# Patient Record
Sex: Female | Born: 1965
Health system: Southern US, Community
[De-identification: ages and names within clinical notes are randomized; demographics above are authoritative.]

## PROBLEM LIST (undated history)

## (undated) DIAGNOSIS — I10 Essential (primary) hypertension: Secondary | ICD-10-CM

## (undated) DIAGNOSIS — K5900 Constipation, unspecified: Secondary | ICD-10-CM

## (undated) DIAGNOSIS — E079 Disorder of thyroid, unspecified: Secondary | ICD-10-CM

## (undated) DIAGNOSIS — E785 Hyperlipidemia, unspecified: Secondary | ICD-10-CM

## (undated) HISTORY — DX: Constipation, unspecified: K59.00

## (undated) HISTORY — DX: Hyperlipidemia, unspecified: E78.5

## (undated) HISTORY — DX: Essential (primary) hypertension: I10

## (undated) HISTORY — PX: DILATION AND CURETTAGE OF UTERUS: SHX78

---

## 1998-08-29 ENCOUNTER — Inpatient Hospital Stay (HOSPITAL_COMMUNITY): Admission: AD | Admit: 1998-08-29 | Discharge: 1998-09-01 | Payer: Self-pay | Admitting: Obstetrics and Gynecology

## 1998-09-02 ENCOUNTER — Encounter (HOSPITAL_COMMUNITY): Admission: RE | Admit: 1998-09-02 | Discharge: 1998-12-01 | Payer: Self-pay | Admitting: Obstetrics and Gynecology

## 2000-01-09 ENCOUNTER — Other Ambulatory Visit: Admission: RE | Admit: 2000-01-09 | Discharge: 2000-01-09 | Payer: Self-pay | Admitting: Obstetrics and Gynecology

## 2000-10-01 ENCOUNTER — Inpatient Hospital Stay (HOSPITAL_COMMUNITY): Admission: AD | Admit: 2000-10-01 | Discharge: 2000-10-03 | Payer: Self-pay | Admitting: *Deleted

## 2000-11-13 ENCOUNTER — Other Ambulatory Visit: Admission: RE | Admit: 2000-11-13 | Discharge: 2000-11-13 | Payer: Self-pay | Admitting: *Deleted

## 2001-11-18 ENCOUNTER — Other Ambulatory Visit: Admission: RE | Admit: 2001-11-18 | Discharge: 2001-11-18 | Payer: Self-pay | Admitting: *Deleted

## 2003-04-30 ENCOUNTER — Other Ambulatory Visit: Admission: RE | Admit: 2003-04-30 | Discharge: 2003-04-30 | Payer: Self-pay | Admitting: *Deleted

## 2004-06-06 ENCOUNTER — Ambulatory Visit (HOSPITAL_COMMUNITY): Admission: RE | Admit: 2004-06-06 | Discharge: 2004-06-06 | Payer: Self-pay | Admitting: Obstetrics

## 2006-05-16 ENCOUNTER — Encounter: Admission: RE | Admit: 2006-05-16 | Discharge: 2006-05-16 | Payer: Self-pay | Admitting: Obstetrics

## 2007-05-15 ENCOUNTER — Other Ambulatory Visit: Admission: RE | Admit: 2007-05-15 | Discharge: 2007-05-15 | Payer: Self-pay | Admitting: Family Medicine

## 2007-05-15 ENCOUNTER — Ambulatory Visit: Payer: Self-pay | Admitting: Family Medicine

## 2007-05-15 ENCOUNTER — Encounter: Payer: Self-pay | Admitting: Family Medicine

## 2007-05-15 DIAGNOSIS — R635 Abnormal weight gain: Secondary | ICD-10-CM | POA: Insufficient documentation

## 2007-05-15 DIAGNOSIS — K589 Irritable bowel syndrome without diarrhea: Secondary | ICD-10-CM

## 2007-05-20 ENCOUNTER — Telehealth (INDEPENDENT_AMBULATORY_CARE_PROVIDER_SITE_OTHER): Payer: Self-pay | Admitting: *Deleted

## 2008-07-13 ENCOUNTER — Ambulatory Visit: Payer: Self-pay | Admitting: Family Medicine

## 2008-07-13 ENCOUNTER — Other Ambulatory Visit: Admission: RE | Admit: 2008-07-13 | Discharge: 2008-07-13 | Payer: Self-pay | Admitting: Family Medicine

## 2008-07-13 ENCOUNTER — Encounter: Payer: Self-pay | Admitting: Family Medicine

## 2008-07-13 DIAGNOSIS — I10 Essential (primary) hypertension: Secondary | ICD-10-CM

## 2008-07-13 DIAGNOSIS — E785 Hyperlipidemia, unspecified: Secondary | ICD-10-CM

## 2008-07-13 LAB — CONVERTED CEMR LAB
ALT: 15 units/L (ref 0–35)
CO2: 19 meq/L (ref 19–32)
Calcium: 9.4 mg/dL (ref 8.4–10.5)
Chloride: 104 meq/L (ref 96–112)
Cholesterol: 211 mg/dL — ABNORMAL HIGH (ref 0–200)
Creatinine, Ser: 0.79 mg/dL (ref 0.40–1.20)
Glucose, Bld: 89 mg/dL (ref 70–99)
Sed Rate: 10 mm/hr (ref 0–22)
Total CHOL/HDL Ratio: 5.6
Total Protein: 7.3 g/dL (ref 6.0–8.3)

## 2008-07-14 ENCOUNTER — Encounter: Payer: Self-pay | Admitting: Family Medicine

## 2008-07-15 ENCOUNTER — Encounter: Admission: RE | Admit: 2008-07-15 | Discharge: 2008-07-15 | Payer: Self-pay | Admitting: Family Medicine

## 2008-07-15 ENCOUNTER — Telehealth: Payer: Self-pay | Admitting: Family Medicine

## 2009-07-21 ENCOUNTER — Encounter: Payer: Self-pay | Admitting: Family Medicine

## 2009-07-21 ENCOUNTER — Ambulatory Visit: Payer: Self-pay | Admitting: Family Medicine

## 2009-07-21 ENCOUNTER — Other Ambulatory Visit: Admission: RE | Admit: 2009-07-21 | Discharge: 2009-07-21 | Payer: Self-pay | Admitting: Family Medicine

## 2009-07-22 ENCOUNTER — Encounter: Payer: Self-pay | Admitting: Family Medicine

## 2009-07-23 LAB — CONVERTED CEMR LAB
Albumin: 4.6 g/dL (ref 3.5–5.2)
CO2: 22 meq/L (ref 19–32)
Cholesterol: 255 mg/dL — ABNORMAL HIGH (ref 0–200)
Glucose, Bld: 92 mg/dL (ref 70–99)
Potassium: 4.4 meq/L (ref 3.5–5.3)
Sodium: 140 meq/L (ref 135–145)
Total Protein: 7 g/dL (ref 6.0–8.3)
Triglycerides: 105 mg/dL (ref <150)

## 2009-07-30 ENCOUNTER — Encounter: Admission: RE | Admit: 2009-07-30 | Discharge: 2009-07-30 | Payer: Self-pay | Admitting: Family Medicine

## 2010-01-31 ENCOUNTER — Ambulatory Visit: Payer: Self-pay | Admitting: Family Medicine

## 2010-01-31 DIAGNOSIS — E663 Overweight: Secondary | ICD-10-CM | POA: Insufficient documentation

## 2010-02-01 ENCOUNTER — Encounter: Payer: Self-pay | Admitting: Family Medicine

## 2010-02-01 LAB — CONVERTED CEMR LAB
Free T4: 0.9 ng/dL (ref 0.80–1.80)
T3, Free: 2.4 pg/mL (ref 2.3–4.2)
TSH: 3.369 microintl units/mL (ref 0.350–4.500)

## 2010-08-05 ENCOUNTER — Encounter: Admission: RE | Admit: 2010-08-05 | Discharge: 2010-08-05 | Payer: Self-pay | Admitting: Family Medicine

## 2010-10-27 NOTE — Assessment & Plan Note (Signed)
Summary: f/u BP/ weight/ chol   Vital Signs:  Patient profile:   45 year old female Height:      64.5 inches Weight:      167 pounds BMI:     28.32 O2 Sat:      100 % on Room air Pulse rate:   87 / minute BP sitting:   135 / 87  (left arm) Cuff size:   large  Vitals Entered By: Payton Spark CMA (Jan 31, 2010 4:05 PM)  O2 Flow:  Room air CC: F/U HTN    Primary Care Provider:  Seymour Bars DO  CC:  F/U HTN .  History of Present Illness: 45 yo WF presents for f/u HTN and high cholesterol.  She is upset that she has not lost any weight despite eating a healthy diet that is portion - controlled.  She is walking but has not changed up her routine.  She is doing well on Norvasc 5 mg/ day.  Denies CP, DOE or leg swelling.  Denies heart palpitations.  She had a very high cholesterol in Oct, started on Crestor, doing well.  She is due to have LFts and LDL checked today.    Current Medications (verified): 1)  Womens One Daily  Tabs (Multiple Vitamins-Minerals) .... Take 1 Tab By Mouth Once Daily 2)  Fish Oil .... Take 2 Tabs By Mouth Once Daily 3)  Fiber 4)  Amlodipine Besylate 5 Mg Tabs (Amlodipine Besylate) .Marland Kitchen.. 1 Tab By Mouth Daily 5)  Crestor 10 Mg Tabs (Rosuvastatin Calcium) .Marland Kitchen.. 1 Tab By Mouth Qhs  Allergies (verified): 1)  ! Asa 2)  ! Tylenol  Past History:  Past Medical History: G7P3 HTN high cholesterol  Past Surgical History: Reviewed history from 05/15/2007 and no changes required. D&C 1998  Family History: Reviewed history from 05/15/2007 and no changes required. Mother and Father with HTN and hyperlipidemia brother and sister healthy  Social History: Reviewed history from 05/15/2007 and no changes required. Speech Therapist, self-employed. Married to [place] and has 3 kids. Never smoked. 1-2 coctails/ day Works out 3 days/wk --getting back into; wants to lose wt.  Review of Systems      See HPI  Physical Exam  General:  alert, well-developed,  well-nourished, and well-hydrated.   Head:  normocephalic and atraumatic.   Mouth:  pharynx pink and moist.   Neck:  no masses.   Lungs:  Normal respiratory effort, chest expands symmetrically. Lungs are clear to auscultation, no crackles or wheezes. Heart:  Normal rate and regular rhythm. S1 and S2 normal without gallop, murmur, click, rub or other extra sounds. Extremities:  no LE edema Skin:  color normal.   Psych:  good eye contact, not anxious appearing, and not depressed appearing.     Impression & Recommendations:  Problem # 1:  ESSENTIAL HYPERTENSION, BENIGN (ICD-401.1) Assessment Improved At goal of <140/90.  Continue.   Her updated medication list for this problem includes:    Amlodipine Besylate 5 Mg Tabs (Amlodipine besylate) .Marland Kitchen... 1 tab by mouth daily  BP today: 135/87 Prior BP: 161/100 (07/21/2009)  Labs Reviewed: K+: 4.4 (07/22/2009) Creat: : 0.79 (07/22/2009)   Chol: 255 (07/22/2009)   HDL: 42 (07/22/2009)   LDL: 192 (07/22/2009)   TG: 105 (07/22/2009)  Problem # 2:  HYPERLIPIDEMIA (ICD-272.4) Due for LDL and LFTs today. Her updated medication list for this problem includes:    Crestor 10 Mg Tabs (Rosuvastatin calcium) .Marland Kitchen... 1 tab by mouth qhs  Orders: T-LDL  Direct [phone]) T-AST/SGOT [phone]) T-ALT/SGPT [phone])  Labs Reviewed: SGOT: 10 (07/22/2009)   SGPT: 10 (07/22/2009)   HDL:42 (07/22/2009), 38 (07/13/2008)  LDL:192 (07/22/2009), 152 (07/13/2008)  Chol:255 (07/22/2009), 211 (07/13/2008)  Trig:105 (07/22/2009), 107 (07/13/2008)  Problem # 3:  OVERWEIGHT (ICD-278.02) BMI 28.  24 hr food recall done.  Eating healthy diet  ~1500 kcal/ day.  Her exercise is only enough to maintain her current wt.  Advised stepping up her exercise routine and doing some cross training.  If failing to lose at her 2 mos f/u; consider adding medicaitons.  Complete Medication List: 1)  Womens One Daily Tabs (Multiple vitamins-minerals) .... Take 1 tab by mouth  once daily 2)  Fish Oil  .... Take 2 tabs by mouth once daily 3)  Fiber  4)  Amlodipine Besylate 5 Mg Tabs (Amlodipine besylate) .Marland Kitchen.. 1 tab by mouth daily 5)  Crestor 10 Mg Tabs (Rosuvastatin calcium) .Marland Kitchen.. 1 tab by mouth qhs  Other Orders: T-T3, Free [phone]) T-T4, Free [phone]) T-TSH [phone])  Patient Instructions: 1)  Lab today. 2)  Will call you w/ results tomorrow. 3)  BP at goal. 4)  Work on increasing exercise to 1 hr (cross training) 5 days/ wk. 5)  Stay on healthy diet.  Try to to get protein at each meals. 6)  Return for weight recheck in 2 mos. Prescriptions: CRESTOR 10 MG TABS (ROSUVASTATIN CALCIUM) 1 tab by mouth qhs  #90 x 1   Entered and Authorized by:   Seymour Bars DO   Signed by:   Seymour Bars DO on 01/31/2010   Method used:   Electronically to        [facility]* (mail-order)             ,          Ph: [phone]       Fax: [phone]   RxID:   [id number] AMLODIPINE BESYLATE 5 MG TABS (AMLODIPINE BESYLATE) 1 tab by mouth daily  #90 x 1   Entered and Authorized by:   Seymour Bars DO   Signed by:   Seymour Bars DO on 01/31/2010   Method used:   Electronically to        [facility]* (mail-order)             ,          Ph: [phone]       Fax: [phone]   RxID:   [id number]

## 2011-04-18 ENCOUNTER — Other Ambulatory Visit: Payer: Self-pay | Admitting: Family Medicine

## 2011-04-23 ENCOUNTER — Encounter: Payer: Self-pay | Admitting: Family Medicine

## 2011-04-24 ENCOUNTER — Encounter: Payer: Self-pay | Admitting: Family Medicine

## 2011-04-24 ENCOUNTER — Other Ambulatory Visit (HOSPITAL_COMMUNITY)
Admission: RE | Admit: 2011-04-24 | Discharge: 2011-04-24 | Disposition: A | Discharge status: Home / Self Care | Payer: BC Managed Care – PPO | Source: Ambulatory Visit | Attending: Family Medicine | Admitting: Family Medicine

## 2011-04-24 ENCOUNTER — Ambulatory Visit (INDEPENDENT_AMBULATORY_CARE_PROVIDER_SITE_OTHER): Payer: BC Managed Care – PPO | Admitting: Family Medicine

## 2011-04-24 DIAGNOSIS — I10 Essential (primary) hypertension: Secondary | ICD-10-CM

## 2011-04-24 DIAGNOSIS — E785 Hyperlipidemia, unspecified: Secondary | ICD-10-CM

## 2011-04-24 DIAGNOSIS — E039 Hypothyroidism, unspecified: Secondary | ICD-10-CM

## 2011-04-24 DIAGNOSIS — Z01419 Encounter for gynecological examination (general) (routine) without abnormal findings: Secondary | ICD-10-CM | POA: Insufficient documentation

## 2011-04-24 MED ORDER — ROSUVASTATIN CALCIUM 10 MG PO TABS: 10.0000 mg | ORAL_TABLET | Freq: Every day | ORAL | Status: DC

## 2011-04-24 MED ORDER — AMLODIPINE BESYLATE 10 MG PO TABS: 10.0000 mg | ORAL_TABLET | Freq: Every day | ORAL | Status: DC

## 2011-04-24 NOTE — Patient Instructions (Signed)
Will call you with pap smear results in the next week.  Update fasting labs. Will call you with results.  Stay on current meds.  Return for f/u BP in 6 mos.

## 2011-04-24 NOTE — Progress Notes (Signed)
  Subjective:    Patient ID: Christina Grimes, female    DOB: 1966/03/03, 45 y.o.   MRN: [id number]  HPI 45 yo WF presents for CPE with pap smear.   She is doing well on her meds, due for labs.  Tdap done 08.   Periods are regular.  Husband had a vasectomy.   No fam hx of premature heart dz, breast or colon cancer.  Her mammogram was done in Sept 2011..  She eats healthy and exercises on a regular basis. She is working on [employer] and regular exercise.  She never did start thyroid medication.  BP 144/89  Pulse 89  Ht 5\' 5"  (1.651 m)  Wt 179 lb (81.194 kg)  BMI 29.79 kg/m2  LMP 04/10/2011     Review of Systems Gen: no fevers, chills, hot flashes, night sweats, change in weight GI: no N/V/C/D GU: no dysuria, incontinence or sexual dysfunction CV: no chest pain, DOE, palpitations s or edema Pulm:  Denies CP, SOB or chronic cough     Objective:   Physical Exam  Pulmonary/Chest: Right breast exhibits no mass, no nipple discharge, no skin change and no tenderness. Left breast exhibits no mass, no nipple discharge, no skin change and no tenderness.  Genitourinary: Vagina normal and uterus normal. No vaginal discharge found.   Gen: alert, well groomed in NAD Neck: no thyromegaly or cervical lymphadenopathy CV: RRR w/o murmur, no audible carotid bruits or abdominal aortic bruits Ext: no edema, clubbing or cyanosis Lungs: CTA bilat w/o W/R/R; nonlabored HEENT:  [place]/AT; PERRLA; oropharynx pink and moist with good dentition Abd: soft, NT, ND, NABS, No HSM, no audible AA bruits Skin: warm and dry; no rash, pallor or jaundice Psych: does not appear anxious or depressed; answers questions appropriately       Assessment & Plan:  Assesment:  1. CPE- Keeping healthy checklist for women reviewed today.  BP a little high, even on repeat.  Will change her Norvasc from 5 to 10 mg/ day.  This has really helped her Raynaud's symptoms.    BMI 29  in the overwt  range.     Labs  ordered Colonoscopy due at 52 Contraception- vasectomy Last pap/- updated today. Mammogram due in Sept. Encouraged healthy diet, regular exercise, MVI daily. Return for next physical in 1 yr.

## 2011-04-26 ENCOUNTER — Telehealth: Payer: Self-pay | Admitting: Family Medicine

## 2011-04-26 LAB — CBC WITH DIFFERENTIAL/PLATELET
Eosinophils Absolute: 0.1 10*3/uL (ref 0.0–0.7)
Eosinophils Relative: 2 % (ref 0–5)
Lymphs Abs: 2.4 10*3/uL (ref 0.7–4.0)
MCH: 30 pg (ref 26.0–34.0)
MCV: 95 fL (ref 78.0–100.0)
Monocytes Absolute: 0.4 10*3/uL (ref 0.1–1.0)
Monocytes Relative: 5 % (ref 3–12)
Platelets: 259 10*3/uL (ref 150–400)
RBC: 4.36 MIL/uL (ref 3.87–5.11)

## 2011-04-26 LAB — COMPLETE METABOLIC PANEL WITH GFR
Albumin: 4.2 g/dL (ref 3.5–5.2)
Alkaline Phosphatase: 61 U/L (ref 39–117)
BUN: 7 mg/dL (ref 6–23)
GFR, Est Non African American: >60 mL/min (ref >60)
Glucose, Bld: 99 mg/dL (ref 70–99)
Total Bilirubin: 0.7 mg/dL (ref 0.3–1.2)

## 2011-04-26 LAB — LIPID PANEL
Cholesterol: 130 mg/dL (ref 0–200)
Total CHOL/HDL Ratio: 3.3 Ratio

## 2011-04-26 NOTE — Telephone Encounter (Signed)
LMOM advising pt of results and rec. 

## 2011-04-26 NOTE — Telephone Encounter (Signed)
Pls let pt know that her blood counts, thyroid function,fasting sugar, liver and kidney function came back normal.  Her cholesterol is at goal other than a low HDL good chol of 39, should be >49.  Add Omega 3 Fish Oil 2 grams day to current meds.  Repeat in 1 yr.

## 2011-04-29 ENCOUNTER — Telehealth: Payer: Self-pay | Admitting: Family Medicine

## 2011-04-29 NOTE — Telephone Encounter (Signed)
Pls let pt know that her pap smear came back nromal.  Repeat in 2 yrs.

## 2011-05-04 NOTE — Telephone Encounter (Signed)
Pt notified of results via VM. KJ LPN 

## 2011-07-25 ENCOUNTER — Other Ambulatory Visit: Payer: Self-pay | Admitting: Family Medicine

## 2011-07-25 DIAGNOSIS — Z1231 Encounter for screening mammogram for malignant neoplasm of breast: Secondary | ICD-10-CM

## 2011-08-10 ENCOUNTER — Ambulatory Visit
Admission: RE | Admit: 2011-08-10 | Discharge: 2011-08-10 | Disposition: A | Discharge status: Home / Self Care | Payer: BC Managed Care – PPO | Source: Ambulatory Visit | Attending: Family Medicine | Admitting: Family Medicine

## 2011-08-10 DIAGNOSIS — Z1231 Encounter for screening mammogram for malignant neoplasm of breast: Secondary | ICD-10-CM

## 2012-03-29 ENCOUNTER — Encounter: Payer: Self-pay | Admitting: Physician Assistant

## 2012-03-29 ENCOUNTER — Ambulatory Visit (INDEPENDENT_AMBULATORY_CARE_PROVIDER_SITE_OTHER): Payer: BC Managed Care – PPO | Admitting: Physician Assistant

## 2012-03-29 VITALS — BP 147/96 | HR 96 | Temp 98.2°F | Ht 65.0 in | Wt 176.0 lb

## 2012-03-29 DIAGNOSIS — R1032 Left lower quadrant pain: Secondary | ICD-10-CM

## 2012-03-29 DIAGNOSIS — N390 Urinary tract infection, site not specified: Secondary | ICD-10-CM

## 2012-03-29 DIAGNOSIS — R109 Unspecified abdominal pain: Secondary | ICD-10-CM

## 2012-03-29 LAB — POCT URINALYSIS DIPSTICK
Bilirubin, UA: NEGATIVE
Ketones, UA: NEGATIVE
Spec Grav, UA: 1.025
pH, UA: 6.5

## 2012-03-29 MED ORDER — CIPROFLOXACIN HCL 500 MG PO TABS: 500.0000 mg | ORAL_TABLET | Freq: Two times a day (BID) | ORAL | Status: AC

## 2012-03-29 NOTE — Patient Instructions (Addendum)
Give [place] a call if you don't here with an appt by Tuesday of next week.  Start Linzess daily for constipation. Call if working and can give prescription.  STart cipro for 3 days for UTI.

## 2012-03-29 NOTE — Progress Notes (Signed)
  Subjective:    Patient ID: Christina Grimes, female    DOB: 1965-11-10, 46 y.o.   MRN: [id number]  HPI Patient presents to clinic with 4-6 weeks of left lower quadrant pain. Denies fever, chills, myalgias. Rate pain 6/10 when active but pain is not constant. Has a hx of chronic constipation but doesn't feel like this is constipation it "feels" different. Denies any blood in stool. She does take a daily probiotic for constipation. Tried miralax and other OTC and have not helped. She has only had one partner and denies any unfaithfulness. Denies any dysuria or vaginal discharge. She has not hx of ovarian problems. Has a monthly period. Her periods have been more heavy recently. Has been taking Advil and has helped some.     Review of Systems     Objective:   Physical Exam  Constitutional: She is oriented to person, place, and time. She appears well-developed and well-nourished.  HENT:  Head: Normocephalic and atraumatic.  Cardiovascular: Normal rate, regular rhythm and normal heart sounds.   Pulmonary/Chest: Effort normal and breath sounds normal. She has no wheezes.       No CVA tenderness.  Abdominal: Soft. Bowel sounds are normal. She exhibits distension. She exhibits no mass. There is tenderness. There is no rebound and no guarding.       Tenderness to palpation over left lower quadrant. Slight distension over abdomen.   Genitourinary: Vagina normal and uterus normal. No vaginal discharge found.       bi manuel exam: negative for tenderness/pain/masses  Neurological: She is alert and oriented to person, place, and time.  Skin: Skin is warm and dry.  Psychiatric: She has a normal mood and affect. Her behavior is normal.          Assessment & Plan:  UTI- UA- +for blood and leuks. Will treat with Cipro for 3 days. Call if not improving.   Lower left abdominal pain/chronic constipation- No masses felt with bi manuel exam. Will get pelvic U/S to evaluate ovaries. Pt has hx of chronic  constipation gave samples of linzess to see if this could help her have a bowel movement and maybe decrease some of her pain. Discussed Linzess and how this is a daily medication to help with constipation. Call office if worsening. Will call with U/S results.

## 2012-04-01 ENCOUNTER — Other Ambulatory Visit (HOSPITAL_BASED_OUTPATIENT_CLINIC_OR_DEPARTMENT_OTHER): Payer: Self-pay | Admitting: *Deleted

## 2012-04-01 ENCOUNTER — Telehealth: Payer: Self-pay | Admitting: *Deleted

## 2012-04-01 ENCOUNTER — Ambulatory Visit (INDEPENDENT_AMBULATORY_CARE_PROVIDER_SITE_OTHER): Payer: BC Managed Care – PPO

## 2012-04-01 ENCOUNTER — Other Ambulatory Visit: Payer: Self-pay | Admitting: Physician Assistant

## 2012-04-01 DIAGNOSIS — R1032 Left lower quadrant pain: Secondary | ICD-10-CM

## 2012-04-01 DIAGNOSIS — N83209 Unspecified ovarian cyst, unspecified side: Secondary | ICD-10-CM

## 2012-04-01 MED ORDER — LINACLOTIDE 290 MCG PO CAPS: 1.0000 | ORAL_CAPSULE | Freq: Every morning | ORAL | Status: DC

## 2012-04-01 NOTE — Telephone Encounter (Signed)
Will send to [facility]. Let patient know that we have cards to give a discounted price even with insurance. She does need to pick up car and will need to call pharmacy with activation numbers. This should significantly make coughs cheaper. I will send over Rx today for a six-month supply but she needs to pick up card as soon as possible.

## 2012-04-01 NOTE — Telephone Encounter (Signed)
Pt calls and states that the Linzess samples that you gave her are working and she would like a prescription for it sent to [facility].

## 2012-06-03 ENCOUNTER — Other Ambulatory Visit: Payer: Self-pay | Admitting: *Deleted

## 2012-06-03 MED ORDER — AMLODIPINE BESYLATE 10 MG PO TABS: 10.0000 mg | ORAL_TABLET | Freq: Every day | ORAL | Status: DC

## 2012-06-12 ENCOUNTER — Encounter: Payer: Self-pay | Admitting: Physician Assistant

## 2012-06-12 ENCOUNTER — Other Ambulatory Visit (HOSPITAL_COMMUNITY)
Admission: RE | Admit: 2012-06-12 | Discharge: 2012-06-12 | Disposition: A | Discharge status: Home / Self Care | Payer: BC Managed Care – PPO | Source: Ambulatory Visit | Attending: Family Medicine | Admitting: Family Medicine

## 2012-06-12 ENCOUNTER — Ambulatory Visit (INDEPENDENT_AMBULATORY_CARE_PROVIDER_SITE_OTHER): Payer: BC Managed Care – PPO | Admitting: Physician Assistant

## 2012-06-12 ENCOUNTER — Encounter: Payer: BC Managed Care – PPO | Admitting: Physician Assistant

## 2012-06-12 VITALS — BP 139/89 | HR 92 | Temp 98.1°F | Ht 65.0 in | Wt 178.0 lb

## 2012-06-12 DIAGNOSIS — E785 Hyperlipidemia, unspecified: Secondary | ICD-10-CM

## 2012-06-12 DIAGNOSIS — I10 Essential (primary) hypertension: Secondary | ICD-10-CM

## 2012-06-12 DIAGNOSIS — K589 Irritable bowel syndrome without diarrhea: Secondary | ICD-10-CM

## 2012-06-12 DIAGNOSIS — Z1239 Encounter for other screening for malignant neoplasm of breast: Secondary | ICD-10-CM

## 2012-06-12 DIAGNOSIS — J029 Acute pharyngitis, unspecified: Secondary | ICD-10-CM

## 2012-06-12 DIAGNOSIS — Z01419 Encounter for gynecological examination (general) (routine) without abnormal findings: Secondary | ICD-10-CM

## 2012-06-12 DIAGNOSIS — Z131 Encounter for screening for diabetes mellitus: Secondary | ICD-10-CM

## 2012-06-12 DIAGNOSIS — Z0000 Encounter for general adult medical examination without abnormal findings: Secondary | ICD-10-CM

## 2012-06-12 MED ORDER — AMLODIPINE BESYLATE 10 MG PO TABS: 10.0000 mg | ORAL_TABLET | Freq: Every day | ORAL | Status: DC

## 2012-06-12 NOTE — Patient Instructions (Addendum)
Mucinex D twice a day for next couple of days. Drink lots of water. Load up on Vitamin C and Zinc. Will call with lab results.  Keep exercising regularly and calcium 4 servings of 500mg  twice a day.

## 2012-06-13 LAB — LIPID PANEL
HDL: 45 mg/dL (ref >39)
LDL Cholesterol: 159 mg/dL — ABNORMAL HIGH (ref 0–99)
Total CHOL/HDL Ratio: 5.2 Ratio
VLDL: 32 mg/dL (ref 0–40)

## 2012-06-13 LAB — COMPREHENSIVE METABOLIC PANEL
ALT: 13 U/L (ref 0–35)
Alkaline Phosphatase: 60 U/L (ref 39–117)
Creat: 0.78 mg/dL (ref 0.50–1.10)
Sodium: 138 mEq/L (ref 135–145)
Total Bilirubin: 1.2 mg/dL (ref 0.3–1.2)
Total Protein: 7.2 g/dL (ref 6.0–8.3)

## 2012-06-13 NOTE — Progress Notes (Signed)
Subjective:     Christina Grimes is a 46 y.o. female and is here for a comprehensive physical exam. The patient reports problems - she has a sore throat that started this morning. she denies any fever, chills, headaches, ear pain. She has not tried anything. Her daughter did have strep throat. Denies any sinus pressure.   Hypertension ongoing uncontrolled. Pt reports always in 120's over 70's at the [facility] when she works out. Denies CP, palpitations, SoB, Headaches, vision changes.  Hyperlipidemia ongoing and not bee controlled. She has not taken Crestor in past 2 weeks because she was out. She tries to maintain a good diet but still eats a lot of fried foods.   Her husband has HPV throat cancer. We will do pap even since last pap was normal. She will need a mammogram in November.   IBS constipation is well controlled with linzess. She is very happy but states it is very experience.   History   Social History  . Marital Status: Married    Spouse Name: N/A    Number of Children: N/A  . Years of Education: N/A   Occupational History  . Not on file.   Social History Main Topics  . Smoking status: Never Smoker   . Smokeless tobacco: Not on file  . Alcohol Use:   . Drug Use:   . Sexually Active:    Other Topics Concern  . Not on file   Social History Narrative  . No narrative on file   Health Maintenance  Topic Date Due  . Mammogram  08/09/2012  . Influenza Vaccine  11/27/2012  . Pap Smear  06/13/2015  . Tetanus/tdap  05/14/2017    The following portions of the patient's history were reviewed and updated as appropriate: allergies, current medications, past family history, past medical history, past social history, past surgical history and problem list.  Review of Systems A comprehensive review of systems was negative.   Objective:    BP 139/89  Pulse 92  Temp 98.1 F (36.7 C) (Oral)  Ht 5\' 5"  (1.651 m)  Wt 178 lb (80.74 kg)  BMI 29.62 kg/m2  SpO2 100%  LMP  05/15/2012 General appearance: alert, cooperative and appears stated age Head: Normocephalic, without obvious abnormality, atraumatic Eyes: conjunctivae/corneas clear. PERRL, EOM's intact. Fundi benign. Ears: normal TM's and external ear canals both ears Nose: Nares normal. Septum midline. Mucosa normal. No drainage or sinus tenderness. Throat: lips, mucosa, and tongue normal; teeth and gums normal Neck: no adenopathy, no carotid bruit, no JVD, supple, symmetrical, trachea midline and thyroid not enlarged, symmetric, no tenderness/mass/nodules Back: symmetric, no curvature. ROM normal. No CVA tenderness. Lungs: clear to auscultation bilaterally Breasts: normal appearance, no masses or tenderness Heart: regular rate and rhythm, S1, S2 normal, no murmur, click, rub or gallop Abdomen: soft, non-tender; bowel sounds normal; no masses,  no organomegaly Pelvic: cervix normal in appearance, external genitalia normal, no adnexal masses or tenderness, no cervical motion tenderness, uterus normal size, shape, and consistency and white thick discharege noted on exam Extremities: extremities normal, atraumatic, no cyanosis or edema Pulses: 2+ and symmetric Skin: Skin color, texture, turgor normal. No rashes or lesions Lymph nodes: Cervical, supraclavicular, and axillary nodes normal. Neurologic: Grossly normal    Assessment:    Healthy female exam.      Plan:    CPE/IBS/Sore throat/HTN/Hyperlipidemia-Gave patient some symptomatic ways to treat sore throat and drainage. It is too soon to treat for infectious causes. If pain worsens then  call office and will treat since you do have a contact for strep. Gave pt samples of Linzess and paperwork to get Linzess at a cheaper price. If not I suggest taking every other day and then we can help with samples. Labs are to be drawn. Encouraged patient to have regular exercise and have healthy diet. Already has Flu shot. Will refer for mammogram. Refilled  mediations. Will refill cholesterol medication after results. Continue to monitor BP if running high 130's/high 80's then call office will consider making some med adjustments. F/U in 3 months to recheck BP. See After Visit Summary for Counseling Recommendations

## 2012-06-14 ENCOUNTER — Other Ambulatory Visit: Payer: Self-pay | Admitting: *Deleted

## 2012-06-14 ENCOUNTER — Other Ambulatory Visit: Payer: Self-pay | Admitting: Physician Assistant

## 2012-06-14 MED ORDER — ROSUVASTATIN CALCIUM 20 MG PO TABS: 20.0000 mg | ORAL_TABLET | Freq: Every day | ORAL | Status: DC

## 2012-06-26 ENCOUNTER — Other Ambulatory Visit: Payer: Self-pay | Admitting: *Deleted

## 2012-06-26 MED ORDER — ROSUVASTATIN CALCIUM 20 MG PO TABS: 20.0000 mg | ORAL_TABLET | Freq: Every day | ORAL | Status: DC

## 2012-06-27 ENCOUNTER — Telehealth: Payer: Self-pay | Admitting: *Deleted

## 2012-06-27 NOTE — Telephone Encounter (Signed)
Pt is asking if you can send a diiferent med other than Crestor. States they are charging her $480 for 3 months.

## 2012-06-28 MED ORDER — ATORVASTATIN CALCIUM 40 MG PO TABS: 40.0000 mg | ORAL_TABLET | Freq: Every day | ORAL | Status: DC

## 2012-06-28 NOTE — Telephone Encounter (Signed)
Crestor was what you were on originally I just increased it. We can try lipitor 40mg  daily.

## 2012-06-28 NOTE — Telephone Encounter (Signed)
Pt.notified

## 2012-08-15 ENCOUNTER — Other Ambulatory Visit: Payer: Self-pay | Admitting: Physician Assistant

## 2012-08-15 ENCOUNTER — Ambulatory Visit
Admission: RE | Admit: 2012-08-15 | Discharge: 2012-08-15 | Disposition: A | Discharge status: Home / Self Care | Payer: BC Managed Care – PPO | Source: Ambulatory Visit | Attending: Physician Assistant | Admitting: Physician Assistant

## 2012-08-15 DIAGNOSIS — R928 Other abnormal and inconclusive findings on diagnostic imaging of breast: Secondary | ICD-10-CM

## 2012-08-15 DIAGNOSIS — Z1239 Encounter for other screening for malignant neoplasm of breast: Secondary | ICD-10-CM

## 2012-08-16 ENCOUNTER — Ambulatory Visit
Admission: RE | Admit: 2012-08-16 | Discharge: 2012-08-16 | Disposition: A | Discharge status: Home / Self Care | Payer: BC Managed Care – PPO | Source: Ambulatory Visit | Attending: Physician Assistant | Admitting: Physician Assistant

## 2012-08-16 DIAGNOSIS — R928 Other abnormal and inconclusive findings on diagnostic imaging of breast: Secondary | ICD-10-CM

## 2013-01-20 ENCOUNTER — Other Ambulatory Visit: Payer: Self-pay | Admitting: *Deleted

## 2013-01-20 MED ORDER — LINACLOTIDE 290 MCG PO CAPS: 1.0000 | ORAL_CAPSULE | Freq: Every morning | ORAL | Status: DC

## 2013-01-20 MED ORDER — ATORVASTATIN CALCIUM 40 MG PO TABS: 40.0000 mg | ORAL_TABLET | Freq: Every day | ORAL | Status: DC

## 2013-05-28 ENCOUNTER — Other Ambulatory Visit: Payer: Self-pay | Admitting: Physician Assistant

## 2013-07-30 ENCOUNTER — Encounter: Payer: Self-pay | Admitting: Physician Assistant

## 2013-07-30 ENCOUNTER — Ambulatory Visit (INDEPENDENT_AMBULATORY_CARE_PROVIDER_SITE_OTHER): Payer: BC Managed Care – PPO | Admitting: Physician Assistant

## 2013-07-30 ENCOUNTER — Other Ambulatory Visit (HOSPITAL_COMMUNITY)
Admission: RE | Admit: 2013-07-30 | Discharge: 2013-07-30 | Disposition: A | Discharge status: Home / Self Care | Payer: BC Managed Care – PPO | Source: Ambulatory Visit | Attending: Family Medicine | Admitting: Family Medicine

## 2013-07-30 VITALS — BP 141/86 | HR 91 | Wt 191.0 lb

## 2013-07-30 DIAGNOSIS — Z01419 Encounter for gynecological examination (general) (routine) without abnormal findings: Secondary | ICD-10-CM | POA: Insufficient documentation

## 2013-07-30 DIAGNOSIS — Z131 Encounter for screening for diabetes mellitus: Secondary | ICD-10-CM

## 2013-07-30 DIAGNOSIS — I10 Essential (primary) hypertension: Secondary | ICD-10-CM

## 2013-07-30 DIAGNOSIS — Z0000 Encounter for general adult medical examination without abnormal findings: Secondary | ICD-10-CM

## 2013-07-30 DIAGNOSIS — Z23 Encounter for immunization: Secondary | ICD-10-CM

## 2013-07-30 DIAGNOSIS — Z1151 Encounter for screening for human papillomavirus (HPV): Secondary | ICD-10-CM | POA: Insufficient documentation

## 2013-07-30 DIAGNOSIS — Z13220 Encounter for screening for lipoid disorders: Secondary | ICD-10-CM

## 2013-07-30 DIAGNOSIS — E785 Hyperlipidemia, unspecified: Secondary | ICD-10-CM

## 2013-07-30 MED ORDER — LINACLOTIDE 290 MCG PO CAPS: 290.0000 ug | ORAL_CAPSULE | Freq: Every morning | ORAL | Status: DC

## 2013-07-30 MED ORDER — LOSARTAN POTASSIUM-HCTZ 50-12.5 MG PO TABS: 1.0000 | ORAL_TABLET | Freq: Every day | ORAL | Status: DC

## 2013-07-30 NOTE — Patient Instructions (Signed)

## 2013-07-30 NOTE — Progress Notes (Signed)
  Subjective:     Christina Grimes is a 47 y.o. female and is here for a comprehensive physical exam. The patient reports no problems.  History   Social History  . Marital Status: Married    Spouse Name: N/A    Number of Children: N/A  . Years of Education: N/A   Occupational History  . Not on file.   Social History Main Topics  . Smoking status: Never Smoker   . Smokeless tobacco: Not on file  . Alcohol Use:   . Drug Use:   . Sexual Activity:    Other Topics Concern  . Not on file   Social History Narrative  . No narrative on file   Health Maintenance  Topic Date Due  . Mammogram  08/16/2013  . Influenza Vaccine  04/25/2014  . Pap Smear  06/13/2015  . Tetanus/tdap  05/14/2017    The following portions of the patient's history were reviewed and updated as appropriate: allergies, current medications, past family history, past medical history, past social history, past surgical history and problem list.  Review of Systems A comprehensive review of systems was negative.   Objective:    BP 141/86  Pulse 91  Wt 191 lb (86.637 kg) General appearance: alert, cooperative, appears stated age and mildly obese Head: Normocephalic, without obvious abnormality, atraumatic Eyes: conjunctivae/corneas clear. PERRL, EOM's intact. Fundi benign. Ears: normal TM's and external ear canals both ears Nose: Nares normal. Septum midline. Mucosa normal. No drainage or sinus tenderness. Throat: lips, mucosa, and tongue normal; teeth and gums normal Neck: no adenopathy, no carotid bruit, no JVD, supple, symmetrical, trachea midline and thyroid not enlarged, symmetric, no tenderness/mass/nodules Back: symmetric, no curvature. ROM normal. No CVA tenderness. Lungs: clear to auscultation bilaterally Breasts: normal appearance, no masses or tenderness Heart: regular rate and rhythm, S1, S2 normal, no murmur, click, rub or gallop Abdomen: soft, non-tender; bowel sounds normal; no masses,  no  organomegaly Pelvic: cervix normal in appearance, external genitalia normal, no adnexal masses or tenderness, no cervical motion tenderness, uterus normal size, shape, and consistency and vagina normal without discharge Extremities: bilateral 1+ edema around ankles. Pulses: 2+ and symmetric Skin: Skin color, texture, turgor normal. No rashes or lesions Lymph nodes: Cervical, supraclavicular, and axillary nodes normal. Neurologic: Grossly normal    Assessment:    Healthy female exam.     Plan:     CPE- Patient's husband has a history of HPV therefore she opts to get Pap smear yearly. Pap done today and we'll call with results. Mammogram scheduled for December. Screening labs were given to patient to have done when fasting for at least 8 hours. Patient was encouraged to participate in regular exercise 3-5 times a week as well as low fat diet. Recommended calcium and vitamin D supplements 1200 mg of calcium and 800 mg of vitamin D. Flu shot was given without complications and the office today.   IBS-patient loves linzess and been paying full price 300 dollars a month. Gave savings car today as well as some samples. Patient was instructed not to pay full price and eyes to work on getting this cheaper.  Ankle swellingHTN -this has been present ever since increasing her dose of Norvasc. I suspect this is a side effect. Listed in the allergy profile. Patient instructed to stop. Patient will was sent Hyzaar to take daily. Blood pressure a little bit elevated today we'll recheck in one month.  See After Visit Summary for Counseling Recommendations

## 2013-08-11 LAB — COMPLETE METABOLIC PANEL WITHOUT GFR
ALT: 23 U/L (ref 0–35)
AST: 15 U/L (ref 0–37)
Albumin: 4.6 g/dL (ref 3.5–5.2)
Alkaline Phosphatase: 74 U/L (ref 39–117)
BUN: 10 mg/dL (ref 6–23)
CO2: 23 meq/L (ref 19–32)
Calcium: 9.6 mg/dL (ref 8.4–10.5)
Chloride: 103 meq/L (ref 96–112)
Creat: 0.79 mg/dL (ref 0.50–1.10)
GFR, Est African American: >89 mL/min
GFR, Est Non African American: >89 mL/min
Glucose, Bld: 96 mg/dL (ref 70–99)
Potassium: 4 meq/L (ref 3.5–5.3)
Sodium: 136 meq/L (ref 135–145)
Total Bilirubin: 1.2 mg/dL (ref 0.3–1.2)
Total Protein: 7.2 g/dL (ref 6.0–8.3)

## 2013-08-11 LAB — LIPID PANEL
LDL Cholesterol: 71 mg/dL (ref 0–99)
VLDL: 24 mg/dL (ref 0–40)

## 2013-08-30 ENCOUNTER — Other Ambulatory Visit: Payer: Self-pay | Admitting: Physician Assistant

## 2013-09-29 ENCOUNTER — Other Ambulatory Visit: Payer: Self-pay

## 2013-09-29 DIAGNOSIS — Z1231 Encounter for screening mammogram for malignant neoplasm of breast: Secondary | ICD-10-CM

## 2013-10-07 ENCOUNTER — Encounter: Payer: Self-pay | Admitting: Physician Assistant

## 2013-10-07 ENCOUNTER — Ambulatory Visit
Admission: RE | Admit: 2013-10-07 | Discharge: 2013-10-07 | Disposition: A | Discharge status: Home / Self Care | Payer: BC Managed Care – PPO | Source: Ambulatory Visit

## 2013-10-07 DIAGNOSIS — Z1231 Encounter for screening mammogram for malignant neoplasm of breast: Secondary | ICD-10-CM

## 2013-10-07 DIAGNOSIS — R928 Other abnormal and inconclusive findings on diagnostic imaging of breast: Secondary | ICD-10-CM | POA: Insufficient documentation

## 2013-10-08 ENCOUNTER — Other Ambulatory Visit: Payer: Self-pay | Admitting: Physician Assistant

## 2013-10-08 DIAGNOSIS — R928 Other abnormal and inconclusive findings on diagnostic imaging of breast: Secondary | ICD-10-CM

## 2013-10-20 ENCOUNTER — Encounter: Payer: Self-pay | Admitting: Physician Assistant

## 2013-10-20 ENCOUNTER — Ambulatory Visit
Admission: RE | Admit: 2013-10-20 | Discharge: 2013-10-20 | Disposition: A | Discharge status: Home / Self Care | Payer: BC Managed Care – PPO | Source: Ambulatory Visit | Attending: Physician Assistant | Admitting: Physician Assistant

## 2013-10-20 DIAGNOSIS — R928 Other abnormal and inconclusive findings on diagnostic imaging of breast: Secondary | ICD-10-CM

## 2013-10-20 DIAGNOSIS — N6320 Unspecified lump in the left breast, unspecified quadrant: Secondary | ICD-10-CM | POA: Insufficient documentation

## 2013-11-10 ENCOUNTER — Telehealth: Payer: Self-pay | Admitting: *Deleted

## 2013-11-10 NOTE — Telephone Encounter (Signed)
Pt left vm asking if you could send something dif or give her some suggestions on something other than the linzess.  She states that it's over $330 & even with the coupon card she's still responsible for around $275.

## 2013-11-10 NOTE — Telephone Encounter (Signed)
Left detailed message on vm.

## 2013-11-10 NOTE — Telephone Encounter (Signed)
The only way it should be that much is if you have a medication deductable to meet? Do you? All medication would apply to deductable.  We can try amitiza but it is a branded product as well. Miralax is the only cheaper option.

## 2013-11-30 ENCOUNTER — Other Ambulatory Visit: Payer: Self-pay | Admitting: Physician Assistant

## 2014-03-17 ENCOUNTER — Other Ambulatory Visit: Payer: Self-pay | Admitting: Physician Assistant

## 2014-03-18 ENCOUNTER — Ambulatory Visit (INDEPENDENT_AMBULATORY_CARE_PROVIDER_SITE_OTHER): Payer: BC Managed Care – PPO | Admitting: Physician Assistant

## 2014-03-18 ENCOUNTER — Encounter: Payer: Self-pay | Admitting: Physician Assistant

## 2014-03-18 ENCOUNTER — Other Ambulatory Visit: Payer: Self-pay | Admitting: Physician Assistant

## 2014-03-18 VITALS — BP 123/72 | HR 91 | Ht 65.0 in | Wt 174.0 lb

## 2014-03-18 DIAGNOSIS — N6002 Solitary cyst of left breast: Secondary | ICD-10-CM

## 2014-03-18 DIAGNOSIS — K589 Irritable bowel syndrome without diarrhea: Secondary | ICD-10-CM

## 2014-03-18 DIAGNOSIS — E785 Hyperlipidemia, unspecified: Secondary | ICD-10-CM

## 2014-03-18 DIAGNOSIS — I10 Essential (primary) hypertension: Secondary | ICD-10-CM

## 2014-03-18 DIAGNOSIS — K5900 Constipation, unspecified: Secondary | ICD-10-CM

## 2014-03-18 MED ORDER — ATORVASTATIN CALCIUM 40 MG PO TABS: ORAL_TABLET | ORAL | Status: DC

## 2014-03-18 MED ORDER — LOSARTAN POTASSIUM-HCTZ 50-12.5 MG PO TABS: ORAL_TABLET | ORAL | Status: DC

## 2014-03-18 NOTE — Progress Notes (Signed)
   Subjective:    Patient ID: Christina Grimes, female    DOB: Mar 18, 1966, 48 y.o.   MRN: [id number]  HPI Pt is a 48 yo female who presents to the clinic for 6 month followup.   Hypertension-patient is currently on Hyzaar daily. She has no concerns or complaints. She denies any chest pains, palpitations, headaches or vision changes. Her blood pressure is well controlled.  IBS/constipation-patient of linzess but cannot afford $290 a month. She's tried these in the savings card but seems to still have to pay the same. She struggles on and off with constipation. MiraLax helps minimally. She wonders if there's anything she can try.  Hyperlipidemia-last LDL was 07/2013 and very controlled. Currently on Lipitor daily. No concerns or complaints.     Review of Systems  All other systems reviewed and are negative.      Objective:   Physical Exam  Constitutional: She is oriented to person, place, and time. She appears well-developed and well-nourished.  HENT:  Head: Normocephalic and atraumatic.  Cardiovascular: Normal rate, regular rhythm and normal heart sounds.   Pulmonary/Chest: Effort normal and breath sounds normal. She has no wheezes.  Neurological: She is alert and oriented to person, place, and time.  Skin: Skin is dry.  Psychiatric: She has a normal mood and affect. Her behavior is normal.          Assessment & Plan:  Hypertension-refilled Hyzaar for 6 months. Followup in 6 months.  IBS/constipation-gave samples of Linzess. Discussed with pt will contact drug company and see why this is so expensive for you. Also gave samples of amitiza 8mg  BID to try with coupon card. If she likes can send over rx and try and see if will be more affordable.   Hyperlipidemia-refilled Lipitor for one year. Patient's LDL in 2014 was very controlled at 71.

## 2014-03-26 ENCOUNTER — Ambulatory Visit
Admission: RE | Admit: 2014-03-26 | Discharge: 2014-03-26 | Disposition: A | Discharge status: Home / Self Care | Payer: BC Managed Care – PPO | Source: Ambulatory Visit | Attending: Physician Assistant | Admitting: Physician Assistant

## 2014-03-26 DIAGNOSIS — N6002 Solitary cyst of left breast: Secondary | ICD-10-CM

## 2014-03-30 ENCOUNTER — Encounter: Payer: Self-pay | Admitting: Physician Assistant

## 2014-06-09 ENCOUNTER — Other Ambulatory Visit: Payer: Self-pay | Admitting: *Deleted

## 2014-06-09 MED ORDER — LINACLOTIDE 290 MCG PO CAPS: 290.0000 ug | ORAL_CAPSULE | Freq: Every morning | ORAL | Status: DC

## 2014-09-22 ENCOUNTER — Other Ambulatory Visit: Payer: Self-pay | Admitting: Physician Assistant

## 2014-09-22 DIAGNOSIS — N6002 Solitary cyst of left breast: Secondary | ICD-10-CM

## 2014-10-06 ENCOUNTER — Telehealth: Payer: Self-pay | Admitting: Physician Assistant

## 2014-10-06 NOTE — Telephone Encounter (Signed)
I have a reminder to get follow up bilateral diagnostic mammogram on left breast mass/cyst evaluation. Has this been scheduled by imaging?

## 2014-10-06 NOTE — Telephone Encounter (Signed)
-----   Message from Donella Stade, [place] sent at 03/30/2014  7:41 AM EDT ----- Bilateral diagnostic mammogram left breat mass/cyst evaluation

## 2014-10-08 NOTE — Telephone Encounter (Signed)
Pt is scheduled for 1/18

## 2014-10-12 ENCOUNTER — Ambulatory Visit
Admission: RE | Admit: 2014-10-12 | Discharge: 2014-10-12 | Disposition: A | Discharge status: Home / Self Care | Payer: BC Managed Care – PPO | Source: Ambulatory Visit | Attending: Physician Assistant | Admitting: Physician Assistant

## 2014-10-12 ENCOUNTER — Other Ambulatory Visit: Payer: Self-pay | Admitting: Physician Assistant

## 2014-10-12 DIAGNOSIS — N6002 Solitary cyst of left breast: Secondary | ICD-10-CM

## 2014-12-18 ENCOUNTER — Other Ambulatory Visit: Payer: Self-pay | Admitting: Physician Assistant

## 2014-12-23 ENCOUNTER — Other Ambulatory Visit: Payer: Self-pay | Admitting: Physician Assistant

## 2015-03-16 ENCOUNTER — Other Ambulatory Visit: Payer: Self-pay | Admitting: *Deleted

## 2015-03-16 MED ORDER — ATORVASTATIN CALCIUM 40 MG PO TABS: ORAL_TABLET | ORAL | Status: DC

## 2015-03-20 ENCOUNTER — Other Ambulatory Visit: Payer: Self-pay | Admitting: Physician Assistant

## 2015-04-22 ENCOUNTER — Ambulatory Visit (INDEPENDENT_AMBULATORY_CARE_PROVIDER_SITE_OTHER): Payer: BC Managed Care – PPO | Admitting: Family Medicine

## 2015-04-22 ENCOUNTER — Encounter: Payer: Self-pay | Admitting: Family Medicine

## 2015-04-22 ENCOUNTER — Other Ambulatory Visit (HOSPITAL_COMMUNITY)
Admission: RE | Admit: 2015-04-22 | Discharge: 2015-04-22 | Disposition: A | Discharge status: Home / Self Care | Payer: BC Managed Care – PPO | Source: Ambulatory Visit | Attending: Family Medicine | Admitting: Family Medicine

## 2015-04-22 VITALS — BP 105/70 | HR 76 | Ht 64.75 in | Wt 167.0 lb

## 2015-04-22 DIAGNOSIS — Z1151 Encounter for screening for human papillomavirus (HPV): Secondary | ICD-10-CM | POA: Insufficient documentation

## 2015-04-22 DIAGNOSIS — Z01411 Encounter for gynecological examination (general) (routine) with abnormal findings: Secondary | ICD-10-CM | POA: Insufficient documentation

## 2015-04-22 DIAGNOSIS — E785 Hyperlipidemia, unspecified: Secondary | ICD-10-CM

## 2015-04-22 DIAGNOSIS — Z124 Encounter for screening for malignant neoplasm of cervix: Secondary | ICD-10-CM

## 2015-04-22 DIAGNOSIS — I10 Essential (primary) hypertension: Secondary | ICD-10-CM

## 2015-04-22 DIAGNOSIS — Z0000 Encounter for general adult medical examination without abnormal findings: Secondary | ICD-10-CM | POA: Diagnosis not present

## 2015-04-22 DIAGNOSIS — Z01419 Encounter for gynecological examination (general) (routine) without abnormal findings: Secondary | ICD-10-CM

## 2015-04-22 LAB — LIPID PANEL
CHOL/HDL RATIO: 3.8 ratio (ref <=5.0)
Cholesterol: 190 mg/dL (ref 125–200)
HDL: 50 mg/dL (ref >=46)
LDL CALC: 116 mg/dL (ref <130)
Triglycerides: 120 mg/dL (ref <150)
VLDL: 24 mg/dL (ref <30)

## 2015-04-22 LAB — COMPLETE METABOLIC PANEL WITH GFR
ALK PHOS: 62 U/L (ref 33–115)
ALT: 20 U/L (ref 6–29)
AST: 18 U/L (ref 10–35)
Albumin: 4.5 g/dL (ref 3.6–5.1)
BILIRUBIN TOTAL: 1.1 mg/dL (ref 0.2–1.2)
BUN: 11 mg/dL (ref 7–25)
CALCIUM: 9.6 mg/dL (ref 8.6–10.2)
CHLORIDE: 102 meq/L (ref 98–110)
CO2: 23 mEq/L (ref 20–31)
Creat: 0.73 mg/dL (ref 0.50–1.10)
GLUCOSE: 92 mg/dL (ref 65–99)
Potassium: 3.9 mEq/L (ref 3.5–5.3)
Sodium: 139 mEq/L (ref 135–146)
TOTAL PROTEIN: 7 g/dL (ref 6.1–8.1)

## 2015-04-22 MED ORDER — LINACLOTIDE 290 MCG PO CAPS: ORAL_CAPSULE | ORAL | Status: DC

## 2015-04-22 MED ORDER — ATORVASTATIN CALCIUM 40 MG PO TABS: ORAL_TABLET | ORAL | Status: DC

## 2015-04-22 MED ORDER — LOSARTAN POTASSIUM-HCTZ 50-12.5 MG PO TABS: ORAL_TABLET | ORAL | Status: DC

## 2015-04-22 NOTE — Patient Instructions (Signed)
Keep up a regular exercise program and make sure you are eating a healthy diet Try to eat 4 servings of dairy a day, or if you are lactose intolerant take a calcium with vitamin D daily.  Your vaccines are up to date.   

## 2015-04-22 NOTE — Progress Notes (Signed)
  Subjective:     Christina Grimes is a 49 y.o. female and is here for a comprehensive physical exam. The patient reports no problems.  History   Social History  . Marital Status: Married    Spouse Name: N/A  . Number of Children: N/A  . Years of Education: N/A   Occupational History  . Not on file.   Social History Main Topics  . Smoking status: Never Smoker   . Smokeless tobacco: Not on file  . Alcohol Use: Not on file  . Drug Use: Not on file  . Sexual Activity: Not on file   Other Topics Concern  . Not on file   Social History Narrative   Health Maintenance  Topic Date Due  . HIV Screening  05/12/1983  . INFLUENZA VACCINE  04/26/2015  . MAMMOGRAM  10/13/2015  . PAP SMEAR  07/30/2016  . TETANUS/TDAP  05/14/2017    The following portions of the patient's history were reviewed and updated as appropriate: allergies, current medications, past family history, past medical history, past social history, past surgical history and problem list.  Review of Systems A comprehensive review of systems was negative.   Objective:    BP 105/70 mmHg  Pulse 76  Ht 5' 4.75" (1.645 m)  Wt 167 lb (75.751 kg)  BMI 27.99 kg/m2  SpO2 100%  LMP 04/12/2015 (Approximate) General appearance: alert, cooperative and appears stated age Head: Normocephalic, without obvious abnormality, atraumatic Eyes: conj clear EOMI, PEERLA Ears: normal TM's and external ear canals both ears Nose: Nares normal. Septum midline. Mucosa normal. No drainage or sinus tenderness. Throat: lips, mucosa, and tongue normal; teeth and gums normal Neck: no adenopathy, no carotid bruit, no JVD, supple, symmetrical, trachea midline and thyroid not enlarged, symmetric, no tenderness/mass/nodules Back: symmetric, no curvature. ROM normal. No CVA tenderness. Lungs: clear to auscultation bilaterally Breasts: normal appearance, no masses or tenderness Heart: regular rate and rhythm, S1, S2 normal, no murmur, click, rub  or gallop Abdomen: soft, non-tender; bowel sounds normal; no masses,  no organomegaly Pelvic: cervix normal in appearance, external genitalia normal, no adnexal masses or tenderness, no cervical motion tenderness, rectovaginal septum normal, uterus normal size, shape, and consistency and vagina normal without discharge Extremities: extremities normal, atraumatic, no cyanosis or edema Pulses: 2+ and symmetric Skin: Skin color, texture, turgor normal. No rashes or lesions Lymph nodes: Cervical, supraclavicular, and axillary nodes normal. Neurologic: Alert and oriented X 3, normal strength and tone. Normal symmetric reflexes. Normal coordination and gait    Assessment:    Healthy female exam.     Plan:     See After Visit Summary for Counseling Recommendations  Keep up a regular exercise program and make sure you are eating a healthy diet Try to eat 4 servings of dairy a day, or if you are lactose intolerant take a calcium with vitamin D daily.  Your vaccines are up to date.   Hypertension- Pt denies chest pain, SOB, dizziness, or heart palpitations.  Taking meds as directed w/o problems.  Denies medication side effects.  Well controlled. F/U in 6 months.   Hyperlipidemia- tolerating statin well. Due to recheck lipids.,   Chronic constipation - doing well on Linzess.  Refills sent. F/U in 6 months.

## 2015-04-23 LAB — HIV ANTIBODY (ROUTINE TESTING W REFLEX): HIV: NONREACTIVE

## 2015-04-26 LAB — CYTOLOGY - PAP

## 2015-05-15 ENCOUNTER — Other Ambulatory Visit: Payer: Self-pay | Admitting: Physician Assistant

## 2015-09-07 ENCOUNTER — Other Ambulatory Visit: Payer: Self-pay | Admitting: Physician Assistant

## 2015-09-07 DIAGNOSIS — N6320 Unspecified lump in the left breast, unspecified quadrant: Secondary | ICD-10-CM

## 2015-10-13 ENCOUNTER — Telehealth: Payer: Self-pay | Admitting: Physician Assistant

## 2015-10-13 NOTE — Telephone Encounter (Signed)
I have in my notes to remind of diagnostic mammogram bilaterally for left breast mass probable cyst. Has this been scheduled?

## 2015-10-13 NOTE — Telephone Encounter (Signed)
-----   Message from Donella Stade, [place] sent at 10/12/2014 11:50 AM EST ----- Diagnostic bilateral mammogram  Left breast mass probable cyst.

## 2015-10-14 ENCOUNTER — Ambulatory Visit
Admission: RE | Admit: 2015-10-14 | Discharge: 2015-10-14 | Disposition: A | Discharge status: Home / Self Care | Payer: BC Managed Care – PPO | Source: Ambulatory Visit | Attending: Physician Assistant | Admitting: Physician Assistant

## 2015-10-14 DIAGNOSIS — N6320 Unspecified lump in the left breast, unspecified quadrant: Secondary | ICD-10-CM

## 2015-10-15 NOTE — Telephone Encounter (Signed)
Done, resulted, & pt notified.

## 2016-02-15 ENCOUNTER — Encounter: Payer: Self-pay | Admitting: Physician Assistant

## 2016-02-15 ENCOUNTER — Ambulatory Visit (INDEPENDENT_AMBULATORY_CARE_PROVIDER_SITE_OTHER): Payer: BLUE CROSS/BLUE SHIELD | Admitting: Physician Assistant

## 2016-02-15 VITALS — BP 113/63 | HR 77 | Ht 64.75 in | Wt 175.0 lb

## 2016-02-15 DIAGNOSIS — K5900 Constipation, unspecified: Secondary | ICD-10-CM | POA: Diagnosis not present

## 2016-02-15 DIAGNOSIS — R87610 Atypical squamous cells of undetermined significance on cytologic smear of cervix (ASC-US): Secondary | ICD-10-CM

## 2016-02-15 DIAGNOSIS — K581 Irritable bowel syndrome with constipation: Secondary | ICD-10-CM | POA: Diagnosis not present

## 2016-02-15 DIAGNOSIS — I10 Essential (primary) hypertension: Secondary | ICD-10-CM | POA: Diagnosis not present

## 2016-02-15 DIAGNOSIS — E785 Hyperlipidemia, unspecified: Secondary | ICD-10-CM

## 2016-02-15 MED ORDER — LINACLOTIDE 290 MCG PO CAPS: ORAL_CAPSULE | ORAL | Status: DC

## 2016-02-15 MED ORDER — LOSARTAN POTASSIUM-HCTZ 50-12.5 MG PO TABS: ORAL_TABLET | ORAL | Status: DC

## 2016-02-15 MED ORDER — ATORVASTATIN CALCIUM 40 MG PO TABS: ORAL_TABLET | ORAL | Status: DC

## 2016-02-15 NOTE — Progress Notes (Signed)
   Subjective:    Patient ID: Christina Grimes, female    DOB: [birth year], 50 y.o.   MRN: [id number]  HPI   Pt is a 50 yo female who presents to the clinic for medication refills.   HTN- doing great. No CP, palpitations, headaches, dizziness. Taking medication daily.   Need lipitor refill.   Constipation- taking linzess every 2-3 days. Works Engineer, manufacturing.    Review of Systems  All other systems reviewed and are negative.      Objective:   Physical Exam  Constitutional: She is oriented to person, place, and time. She appears well-developed and well-nourished.  HENT:  Head: Normocephalic and atraumatic.  Cardiovascular: Normal rate, regular rhythm and normal heart sounds.   Pulmonary/Chest: Effort normal and breath sounds normal.  Neurological: She is alert and oriented to person, place, and time.  Psychiatric: She has a normal mood and affect. Her behavior is normal.          Assessment & Plan:  IBS/constipation- refilled [facility] and printed to see which one is cheaper. Gave coupon card.   Hyperlipidemia- controlled, refilled lipitor for one year. Last drawn 03/2016.   HTN- controlled, refilled hyzaar for one year.   Ascus with negative HPV- pap due in 04/25/2016. Please schedule.

## 2016-02-16 ENCOUNTER — Encounter: Payer: Self-pay | Admitting: Physician Assistant

## 2016-03-28 DIAGNOSIS — N23 Unspecified renal colic: Secondary | ICD-10-CM | POA: Diagnosis not present

## 2016-03-28 DIAGNOSIS — Z888 Allergy status to other drugs, medicaments and biological substances status: Secondary | ICD-10-CM | POA: Diagnosis not present

## 2016-03-28 DIAGNOSIS — R1031 Right lower quadrant pain: Secondary | ICD-10-CM | POA: Diagnosis not present

## 2016-03-28 DIAGNOSIS — N201 Calculus of ureter: Secondary | ICD-10-CM | POA: Diagnosis not present

## 2016-03-28 DIAGNOSIS — Z886 Allergy status to analgesic agent status: Secondary | ICD-10-CM | POA: Diagnosis not present

## 2016-05-03 ENCOUNTER — Ambulatory Visit (INDEPENDENT_AMBULATORY_CARE_PROVIDER_SITE_OTHER): Payer: BLUE CROSS/BLUE SHIELD | Admitting: Physician Assistant

## 2016-05-03 ENCOUNTER — Other Ambulatory Visit (HOSPITAL_COMMUNITY)
Admission: RE | Admit: 2016-05-03 | Discharge: 2016-05-03 | Disposition: A | Discharge status: Home / Self Care | Payer: BLUE CROSS/BLUE SHIELD | Source: Ambulatory Visit | Attending: Physician Assistant | Admitting: Physician Assistant

## 2016-05-03 ENCOUNTER — Encounter: Payer: Self-pay | Admitting: Physician Assistant

## 2016-05-03 VITALS — BP 118/68 | HR 74 | Ht 64.75 in | Wt 182.0 lb

## 2016-05-03 DIAGNOSIS — Z13220 Encounter for screening for lipoid disorders: Secondary | ICD-10-CM | POA: Diagnosis not present

## 2016-05-03 DIAGNOSIS — Z1151 Encounter for screening for human papillomavirus (HPV): Secondary | ICD-10-CM | POA: Diagnosis not present

## 2016-05-03 DIAGNOSIS — Z01411 Encounter for gynecological examination (general) (routine) with abnormal findings: Secondary | ICD-10-CM | POA: Insufficient documentation

## 2016-05-03 DIAGNOSIS — Z131 Encounter for screening for diabetes mellitus: Secondary | ICD-10-CM

## 2016-05-03 DIAGNOSIS — Z01419 Encounter for gynecological examination (general) (routine) without abnormal findings: Secondary | ICD-10-CM

## 2016-05-03 DIAGNOSIS — R87610 Atypical squamous cells of undetermined significance on cytologic smear of cervix (ASC-US): Secondary | ICD-10-CM

## 2016-05-03 DIAGNOSIS — R635 Abnormal weight gain: Secondary | ICD-10-CM

## 2016-05-03 DIAGNOSIS — Z0000 Encounter for general adult medical examination without abnormal findings: Secondary | ICD-10-CM | POA: Diagnosis not present

## 2016-05-03 LAB — TSH: TSH: 3.17 m[IU]/L

## 2016-05-03 MED ORDER — LUBIPROSTONE 24 MCG PO CAPS: 24.0000 ug | ORAL_CAPSULE | Freq: Two times a day (BID) | ORAL | 3 refills | Status: DC

## 2016-05-03 NOTE — Progress Notes (Addendum)
Subjective:     Christina Grimes is a 50 y.o. female and is here for a comprehensive physical exam. The patient reports she is here because of abnormal cells on last pap but negative for HPV. .  Social History   Social History  . Marital status: Married    Spouse name: N/A  . Number of children: 3  . Years of education: N/A   Occupational History  . Not on file.   Social History Main Topics  . Smoking status: Never Smoker  . Smokeless tobacco: Not on file  . Alcohol use 1.2 oz/week    2 Standard drinks or equivalent per week  . Drug use: No  . Sexual activity: Yes    Partners: Male   Other Topics Concern  . 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    Health Maintenance  Topic Date Due  . INFLUENZA VACCINE  04/25/2016  . MAMMOGRAM  10/13/2016  . TETANUS/TDAP  05/14/2017  . PAP SMEAR  04/21/2018  . HIV Screening  Completed    The following portions of the patient's history were reviewed and updated as appropriate: allergies, current medications, past family history, past medical history, past social history, past surgical history and problem list.  Review of Systems Pertinent items noted in HPI and remainder of comprehensive ROS otherwise negative.   Objective:    BP 118/68   Pulse 74   Ht 5' 4.75" (1.645 m)   Wt 182 lb (82.6 kg)   BMI 30.52 kg/m  General appearance: alert, cooperative and appears stated age Head: Normocephalic, without obvious abnormality, atraumatic Eyes: conjunctivae/corneas clear. PERRL, EOM's intact. Fundi benign. Ears: normal TM's and external ear canals both ears Nose: Nares normal. Septum midline. Mucosa normal. No drainage or sinus tenderness. Throat: lips, mucosa, and tongue normal; teeth and gums normal Neck: no adenopathy, no carotid bruit, no JVD, supple, symmetrical, trachea midline and thyroid not enlarged, symmetric, no tenderness/mass/nodules Back: symmetric, no  curvature. ROM normal. No CVA tenderness. Lungs: clear to auscultation bilaterally Heart: regular rate and rhythm, S1, S2 normal, no murmur, click, rub or gallop Abdomen: soft, non-tender; bowel sounds normal; no masses,  no organomegaly Pelvic: cervix normal in appearance, external genitalia normal, no adnexal masses or tenderness, no cervical motion tenderness, uterus normal size, shape, and consistency and vagina normal without discharge Extremities: extremities normal, atraumatic, no cyanosis or edema Pulses: 2+ and symmetric Skin: Skin color, texture, turgor normal. No rashes or lesions Lymph nodes: Cervical, supraclavicular, and axillary nodes normal. Neurologic: Alert and oriented X 3, normal strength and tone. Normal symmetric reflexes. Normal coordination and gait    Assessment:    Healthy female exam.      Plan:  CPE/ASCUS- pap done today with HPV. Declined STI testings. Lipid, cmp, TSH ordered. Discussed vitamin d [phone] units daily and calcium 1500mg . Discussed diet and exercise. Vaccines up to date. Mammogram normal in Jan 2017.   Abnormal weight gain- discussed medication if no weight loss with diet changes. She did well with whole 30 diet. She was down to 147lbs now back up because she stopped to 187. Follow up as needed. TSH ordered.    See After Visit Summary for Counseling Recommendations

## 2016-05-03 NOTE — Patient Instructions (Signed)

## 2016-05-04 ENCOUNTER — Encounter: Payer: Self-pay | Admitting: Physician Assistant

## 2016-05-04 DIAGNOSIS — R748 Abnormal levels of other serum enzymes: Secondary | ICD-10-CM | POA: Insufficient documentation

## 2016-05-04 LAB — COMPLETE METABOLIC PANEL WITH GFR
ALT: 52 U/L — AB (ref 6–29)
AST: 38 U/L — ABNORMAL HIGH (ref 10–35)
Albumin: 4.2 g/dL (ref 3.6–5.1)
Alkaline Phosphatase: 55 U/L (ref 33–115)
BILIRUBIN TOTAL: 0.7 mg/dL (ref 0.2–1.2)
BUN: 11 mg/dL (ref 7–25)
CALCIUM: 9.5 mg/dL (ref 8.6–10.2)
CO2: 24 mmol/L (ref 20–31)
CREATININE: 0.83 mg/dL (ref 0.50–1.10)
Chloride: 105 mmol/L (ref 98–110)
GFR, Est Non African American: 84 mL/min (ref >=60)
Glucose, Bld: 96 mg/dL (ref 65–99)
Potassium: 4.2 mmol/L (ref 3.5–5.3)
Sodium: 139 mmol/L (ref 135–146)
TOTAL PROTEIN: 6.4 g/dL (ref 6.1–8.1)

## 2016-05-04 LAB — LIPID PANEL
CHOLESTEROL: 151 mg/dL (ref 125–200)
HDL: 48 mg/dL (ref >=46)
LDL Cholesterol: 81 mg/dL (ref <130)
TRIGLYCERIDES: 109 mg/dL (ref <150)
Total CHOL/HDL Ratio: 3.1 Ratio (ref <=5.0)
VLDL: 22 mg/dL (ref <30)

## 2016-05-04 LAB — CYTOLOGY - PAP

## 2016-06-16 ENCOUNTER — Telehealth: Payer: Self-pay | Admitting: *Deleted

## 2016-06-16 DIAGNOSIS — R748 Abnormal levels of other serum enzymes: Secondary | ICD-10-CM

## 2016-06-16 NOTE — Telephone Encounter (Signed)
CMP ordered to recheck liver enzymes.

## 2016-06-20 DIAGNOSIS — R748 Abnormal levels of other serum enzymes: Secondary | ICD-10-CM | POA: Diagnosis not present

## 2016-06-21 LAB — COMPLETE METABOLIC PANEL WITH GFR
ALBUMIN: 4.5 g/dL (ref 3.6–5.1)
ALK PHOS: 63 U/L (ref 33–115)
ALT: 20 U/L (ref 6–29)
AST: 18 U/L (ref 10–35)
BILIRUBIN TOTAL: 1.2 mg/dL (ref 0.2–1.2)
BUN: 11 mg/dL (ref 7–25)
CALCIUM: 9.7 mg/dL (ref 8.6–10.2)
CO2: 23 mmol/L (ref 20–31)
CREATININE: 0.9 mg/dL (ref 0.50–1.10)
Chloride: 102 mmol/L (ref 98–110)
GFR, Est African American: 87 mL/min (ref >=60)
GFR, Est Non African American: 76 mL/min (ref >=60)
Glucose, Bld: 95 mg/dL (ref 65–99)
Potassium: 3.7 mmol/L (ref 3.5–5.3)
Sodium: 138 mmol/L (ref 135–146)
TOTAL PROTEIN: 6.8 g/dL (ref 6.1–8.1)

## 2016-09-05 ENCOUNTER — Other Ambulatory Visit: Payer: Self-pay | Admitting: Physician Assistant

## 2016-09-05 DIAGNOSIS — Z1231 Encounter for screening mammogram for malignant neoplasm of breast: Secondary | ICD-10-CM

## 2016-10-16 ENCOUNTER — Ambulatory Visit
Admission: RE | Admit: 2016-10-16 | Discharge: 2016-10-16 | Disposition: A | Discharge status: Home / Self Care | Payer: BLUE CROSS/BLUE SHIELD | Source: Ambulatory Visit | Attending: Physician Assistant | Admitting: Physician Assistant

## 2016-10-16 DIAGNOSIS — Z1231 Encounter for screening mammogram for malignant neoplasm of breast: Secondary | ICD-10-CM | POA: Diagnosis not present

## 2016-10-17 NOTE — Progress Notes (Signed)
Call pt: screening mammogram normal. Follow up in one year.

## 2017-05-28 ENCOUNTER — Other Ambulatory Visit: Payer: Self-pay | Admitting: Physician Assistant

## 2017-06-12 ENCOUNTER — Ambulatory Visit (INDEPENDENT_AMBULATORY_CARE_PROVIDER_SITE_OTHER): Payer: BLUE CROSS/BLUE SHIELD | Admitting: Physician Assistant

## 2017-06-12 ENCOUNTER — Encounter: Payer: Self-pay | Admitting: Physician Assistant

## 2017-06-12 VITALS — BP 134/64 | HR 73 | Ht 64.75 in | Wt 182.0 lb

## 2017-06-12 DIAGNOSIS — Z23 Encounter for immunization: Secondary | ICD-10-CM

## 2017-06-12 DIAGNOSIS — Z6830 Body mass index (BMI) 30.0-30.9, adult: Secondary | ICD-10-CM

## 2017-06-12 DIAGNOSIS — E7800 Pure hypercholesterolemia, unspecified: Secondary | ICD-10-CM

## 2017-06-12 DIAGNOSIS — Z0000 Encounter for general adult medical examination without abnormal findings: Secondary | ICD-10-CM | POA: Diagnosis not present

## 2017-06-12 DIAGNOSIS — I10 Essential (primary) hypertension: Secondary | ICD-10-CM

## 2017-06-12 DIAGNOSIS — Z1231 Encounter for screening mammogram for malignant neoplasm of breast: Secondary | ICD-10-CM | POA: Diagnosis not present

## 2017-06-12 DIAGNOSIS — K5904 Chronic idiopathic constipation: Secondary | ICD-10-CM | POA: Diagnosis not present

## 2017-06-12 DIAGNOSIS — R635 Abnormal weight gain: Secondary | ICD-10-CM | POA: Diagnosis not present

## 2017-06-12 DIAGNOSIS — Z131 Encounter for screening for diabetes mellitus: Secondary | ICD-10-CM

## 2017-06-12 MED ORDER — LOSARTAN POTASSIUM-HCTZ 50-12.5 MG PO TABS: 1.0000 | ORAL_TABLET | Freq: Every day | ORAL | 3 refills | Status: DC

## 2017-06-12 MED ORDER — LINACLOTIDE 290 MCG PO CAPS: 290.0000 ug | ORAL_CAPSULE | Freq: Every day | ORAL | 3 refills | Status: DC

## 2017-06-12 MED ORDER — ATORVASTATIN CALCIUM 40 MG PO TABS
40.0000 mg | ORAL_TABLET | Freq: Every day | ORAL | 3 refills | Status: DC
Start: 2017-06-12 — End: 2017-06-19

## 2017-06-12 NOTE — Progress Notes (Signed)
Subjective:     Christina Grimes is a 51 y.o. female and is here for a comprehensive physical exam. The patient reports problems - she continues to use linzess for constipation and it works but she pays over 1000 dollars for this. she would like something cheaper. she is very frustrated with her weight as well. she feels like she is eating well but continues to gain. she is not exercising. .   Social History   Social History  . Marital status: Married    Spouse name: N/A  . Number of children: 3  . Years of education: N/A   Occupational History  . Not on file.   Social History Main Topics  . Smoking status: Never Smoker  . Smokeless tobacco: Never Used  . Alcohol use 1.2 oz/week    2 Standard drinks or equivalent per week  . Drug use: No  . Sexual activity: Yes    Partners: Male   Other Topics Concern  . 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    Health Maintenance  Topic Date Due  . MAMMOGRAM  10/16/2017  . PAP SMEAR  05/04/2019  . TETANUS/TDAP  06/13/2027  . INFLUENZA VACCINE  Completed  . HIV Screening  Completed    The following portions of the patient's history were reviewed and updated as appropriate: allergies, current medications, past family history, past medical history, past social history, past surgical history and problem list.  Review of Systems Pertinent items noted in HPI and remainder of comprehensive ROS otherwise negative.   Objective:    BP 134/64   Pulse 73   Ht 5' 4.75" (1.645 m)   Wt 182 lb (82.6 kg)   BMI 30.52 kg/m  General appearance: alert, cooperative, appears stated age and mildly obese Head: Normocephalic, without obvious abnormality, atraumatic Eyes: conjunctivae/corneas clear. PERRL, EOM's intact. Fundi benign. Ears: normal TM's and external ear canals both ears Nose: Nares normal. Septum midline. Mucosa normal. No drainage or sinus tenderness. Throat: lips, mucosa,  and tongue normal; teeth and gums normal Neck: no adenopathy, no carotid bruit, no JVD, supple, symmetrical, trachea midline and thyroid not enlarged, symmetric, no tenderness/mass/nodules Back: symmetric, no curvature. ROM normal. No CVA tenderness. Lungs: clear to auscultation bilaterally Breasts: Not done.  Heart: regular rate and rhythm, S1, S2 normal, no murmur, click, rub or gallop Abdomen: soft, non-tender; bowel sounds normal; no masses,  no organomegaly Pelvic: Not done.  Extremities: extremities normal, atraumatic, no cyanosis or edema Pulses: 2+ and symmetric Skin: Skin color, texture, turgor normal. No rashes or lesions Lymph nodes: Cervical, supraclavicular, and axillary nodes normal. Neurologic: Alert and oriented X 3, normal strength and tone. Normal symmetric reflexes. Normal coordination and gait    Assessment:    Healthy female exam.     Plan:    Marland KitchenMarland KitchenJaleiyah was seen today for annual exam.  Diagnoses and all orders for this visit:  Routine physical examination -     Flu Vaccine QUAD 6+ mos PF IM (Fluarix Quad PF) -     Lipid Panel w/reflex Direct LDL -     COMPLETE METABOLIC PANEL WITH GFR -     linaclotide (LINZESS) 290 MCG CAPS capsule; Take 1 capsule (290 mcg total) by mouth daily before breakfast. -     TSH -     B12 -     VITAMIN D 25 Hydroxy (Vit-D Deficiency, Fractures) -  CBC -     losartan-hydrochlorothiazide (HYZAAR) 50-12.5 MG tablet; Take 1 tablet by mouth daily. -     atorvastatin (LIPITOR) 40 MG tablet; Take 1 tablet (40 mg total) by mouth daily. -     lubiprostone (AMITIZA) 24 MCG capsule; Take 1 capsule (24 mcg total) by mouth 2 (two) times daily with a meal.  Influenza vaccine needed -     Flu Vaccine QUAD 6+ mos PF IM (Fluarix Quad PF)  Pure hypercholesterolemia -     Lipid Panel w/reflex Direct LDL -     atorvastatin (LIPITOR) 40 MG tablet; Take 1 tablet (40 mg total) by mouth daily.  Screening for diabetes mellitus -     COMPLETE  METABOLIC PANEL WITH GFR  Abnormal weight gain -     TSH -     B12 -     VITAMIN D 25 Hydroxy (Vit-D Deficiency, Fractures) -     CBC  Essential hypertension, benign -     losartan-hydrochlorothiazide (HYZAAR) 50-12.5 MG tablet; Take 1 tablet by mouth daily.  Need for Tdap vaccination -     Tdap vaccine greater than or equal to 7yo IM  BMI 30.0-30.9,adult -     B12 -     VITAMIN D 25 Hydroxy (Vit-D Deficiency, Fractures) -     CBC  Chronic idiopathic constipation -     linaclotide (LINZESS) 290 MCG CAPS capsule; Take 1 capsule (290 mcg total) by mouth daily before breakfast. -     lubiprostone (AMITIZA) 24 MCG capsule; Take 1 capsule (24 mcg total) by mouth 2 (two) times daily with a meal.   .. Depression screen [facility] 2/9 06/12/2017  Decreased Interest 0  Down, Depressed, Hopeless 0  PHQ - 2 Score 0   Pap last year.  Mammogram ordered.   .. Discussed 150 minutes of exercise a week.  Encouraged vitamin D 1000 units and Calcium 1300mg  or 4 servings of dairy a day.  Marland Kitchen.Discussed low carb diet with 1500 calories and 80g of protein.  [employer] could be a Microbiologist.  Discussed medications. Will hold for now.   Will try coupon card for linzess one more time but if does not work will try [place].     See After Visit Summary for Counseling Recommendations

## 2017-06-12 NOTE — Patient Instructions (Signed)

## 2017-06-13 ENCOUNTER — Telehealth: Payer: Self-pay | Admitting: Physician Assistant

## 2017-06-13 LAB — COMPLETE METABOLIC PANEL WITH GFR
AG Ratio: 2 (calc) (ref 1.0–2.5)
ALKALINE PHOSPHATASE (APISO): 66 U/L (ref 33–115)
ALT: 22 U/L (ref 6–29)
AST: 16 U/L (ref 10–35)
Albumin: 4.4 g/dL (ref 3.6–5.1)
BUN: 10 mg/dL (ref 7–25)
CALCIUM: 9.5 mg/dL (ref 8.6–10.2)
CO2: 27 mmol/L (ref 20–32)
CREATININE: 0.81 mg/dL (ref 0.50–1.10)
Chloride: 104 mmol/L (ref 98–110)
GFR, Est African American: 99 mL/min/{1.73_m2} (ref >=60)
GFR, Est Non African American: 85 mL/min/{1.73_m2} (ref >=60)
GLOBULIN: 2.2 g/dL (ref 1.9–3.7)
Glucose, Bld: 95 mg/dL (ref 65–99)
POTASSIUM: 3.6 mmol/L (ref 3.5–5.3)
SODIUM: 140 mmol/L (ref 135–146)
Total Bilirubin: 0.9 mg/dL (ref 0.2–1.2)
Total Protein: 6.6 g/dL (ref 6.1–8.1)

## 2017-06-13 LAB — LIPID PANEL W/REFLEX DIRECT LDL
CHOL/HDL RATIO: 4.3 (calc) (ref <5.0)
CHOLESTEROL: 167 mg/dL (ref <200)
HDL: 39 mg/dL — ABNORMAL LOW (ref >50)
LDL CHOLESTEROL (CALC): 100 mg/dL — AB
NON-HDL CHOLESTEROL (CALC): 128 mg/dL (ref <130)
Triglycerides: 180 mg/dL — ABNORMAL HIGH (ref <150)

## 2017-06-13 LAB — CBC
HCT: 38.5 % (ref 35.0–45.0)
Hemoglobin: 12.9 g/dL (ref 11.7–15.5)
MCH: 30.4 pg (ref 27.0–33.0)
MCHC: 33.5 g/dL (ref 32.0–36.0)
MCV: 90.6 fL (ref 80.0–100.0)
MPV: 10.2 fL (ref 7.5–12.5)
PLATELETS: 291 10*3/uL (ref 140–400)
RBC: 4.25 10*6/uL (ref 3.80–5.10)
RDW: 12.7 % (ref 11.0–15.0)
WBC: 8.6 10*3/uL (ref 3.8–10.8)

## 2017-06-13 LAB — VITAMIN D 25 HYDROXY (VIT D DEFICIENCY, FRACTURES): Vit D, 25-Hydroxy: 41 ng/mL (ref 30–100)

## 2017-06-13 LAB — VITAMIN B12: Vitamin B-12: 549 pg/mL (ref 200–1100)

## 2017-06-13 LAB — TSH: TSH: 4.29 m[IU]/L

## 2017-06-13 NOTE — Telephone Encounter (Signed)
Find out what her questions are about her labs?  Other than miralax all constipation drugs are brand name and I guess would fall into same boat. We could try amitiza? It is twice a day? Would she like to try it?

## 2017-06-13 NOTE — Telephone Encounter (Signed)
Christina Grimes returned Christina Grimes's call about  lab results.  I gave her the results but she still has questions. She also said [name] was to recommend another med to her since the cost of the Linzess was so high - $1,045.  Thank you.

## 2017-06-13 NOTE — Progress Notes (Signed)
Call pt: TG went up by about 70 points. Need to watch those carbs and sugars. Consider starting fish oil 4000mg .  LDL looks great. But HDL dropped. We want to try to get that higher with exercise and good fats.  B12 and vitamin D look great.  TSH in normal range but starting to trend to the upper limits of normal. I woulld like to recheck in 3 months to make sure trend is not going into abnormal range.

## 2017-06-14 DIAGNOSIS — Z6830 Body mass index (BMI) 30.0-30.9, adult: Secondary | ICD-10-CM | POA: Insufficient documentation

## 2017-06-14 MED ORDER — LUBIPROSTONE 24 MCG PO CAPS: 24.0000 ug | ORAL_CAPSULE | Freq: Two times a day (BID) | ORAL | 3 refills | Status: DC

## 2017-06-14 NOTE — Telephone Encounter (Signed)
She would like to to try amitiza, [facility].  She said with the coupon and her insurance for linzess, she has to pay the retail, so she pays over $900 out of pocket, they pay $75, then she has to pay the remaining $30.

## 2017-06-15 NOTE — Telephone Encounter (Signed)
Ok I printed off and in box to [facility].

## 2017-06-19 ENCOUNTER — Other Ambulatory Visit: Payer: Self-pay

## 2017-06-19 DIAGNOSIS — E7800 Pure hypercholesterolemia, unspecified: Secondary | ICD-10-CM

## 2017-06-19 DIAGNOSIS — I10 Essential (primary) hypertension: Secondary | ICD-10-CM

## 2017-06-19 DIAGNOSIS — Z0000 Encounter for general adult medical examination without abnormal findings: Secondary | ICD-10-CM

## 2017-06-19 MED ORDER — ATORVASTATIN CALCIUM 40 MG PO TABS: 40.0000 mg | ORAL_TABLET | Freq: Every day | ORAL | 3 refills | Status: DC

## 2017-06-19 MED ORDER — LOSARTAN POTASSIUM-HCTZ 50-12.5 MG PO TABS: 1.0000 | ORAL_TABLET | Freq: Every day | ORAL | 3 refills | Status: DC

## 2017-06-26 NOTE — Telephone Encounter (Signed)
Followed up with pt about the amitiza and she says it is too expensive and is there anything else she can try.

## 2017-06-26 NOTE — Telephone Encounter (Signed)
Patient is going to try the patient assistant program. She wanted to know if there are any other medications she could try.

## 2017-06-26 NOTE — Telephone Encounter (Signed)
Other than miralax and ducolax there is nothing else that is not a branded drug.

## 2017-06-27 NOTE — Telephone Encounter (Signed)
Left recommendation on Pt's VM. Callback provided for any questions.  

## 2017-06-28 NOTE — Telephone Encounter (Signed)
Patient advised of recommendations. She states she has filled out the patient assistant form and will drop or mail it later for [facility] to sign.

## 2017-06-28 NOTE — Telephone Encounter (Signed)
Ok will do.

## 2017-09-26 NOTE — Telephone Encounter (Signed)
Our office received 3 bottles of Linzess 290MCG for patient through [employer]'s Patient Assistance Program.

## 2017-12-14 ENCOUNTER — Ambulatory Visit: Payer: BLUE CROSS/BLUE SHIELD

## 2018-01-01 ENCOUNTER — Ambulatory Visit
Admission: RE | Admit: 2018-01-01 | Discharge: 2018-01-01 | Disposition: A | Discharge status: Home / Self Care | Payer: BLUE CROSS/BLUE SHIELD | Source: Ambulatory Visit | Attending: Physician Assistant | Admitting: Physician Assistant

## 2018-01-01 DIAGNOSIS — Z1231 Encounter for screening mammogram for malignant neoplasm of breast: Secondary | ICD-10-CM | POA: Diagnosis not present

## 2018-01-02 NOTE — Progress Notes (Signed)
Call pt: normal mammogram follow up in 1 year.

## 2018-07-16 DIAGNOSIS — L570 Actinic keratosis: Secondary | ICD-10-CM | POA: Diagnosis not present

## 2018-07-16 DIAGNOSIS — C44712 Basal cell carcinoma of skin of right lower limb, including hip: Secondary | ICD-10-CM | POA: Diagnosis not present

## 2018-07-16 DIAGNOSIS — C44719 Basal cell carcinoma of skin of left lower limb, including hip: Secondary | ICD-10-CM | POA: Diagnosis not present

## 2018-07-16 DIAGNOSIS — C44319 Basal cell carcinoma of skin of other parts of face: Secondary | ICD-10-CM | POA: Diagnosis not present

## 2018-07-16 DIAGNOSIS — L814 Other melanin hyperpigmentation: Secondary | ICD-10-CM | POA: Diagnosis not present

## 2018-07-16 DIAGNOSIS — D225 Melanocytic nevi of trunk: Secondary | ICD-10-CM | POA: Diagnosis not present

## 2018-07-22 ENCOUNTER — Encounter: Payer: Self-pay | Admitting: Physician Assistant

## 2018-07-22 DIAGNOSIS — C4491 Basal cell carcinoma of skin, unspecified: Secondary | ICD-10-CM | POA: Insufficient documentation

## 2018-08-01 DIAGNOSIS — C44712 Basal cell carcinoma of skin of right lower limb, including hip: Secondary | ICD-10-CM | POA: Diagnosis not present

## 2018-08-01 DIAGNOSIS — C44319 Basal cell carcinoma of skin of other parts of face: Secondary | ICD-10-CM | POA: Diagnosis not present

## 2018-08-01 DIAGNOSIS — C44719 Basal cell carcinoma of skin of left lower limb, including hip: Secondary | ICD-10-CM | POA: Diagnosis not present

## 2018-08-08 ENCOUNTER — Encounter: Payer: Self-pay | Admitting: Family Medicine

## 2018-08-08 ENCOUNTER — Ambulatory Visit (INDEPENDENT_AMBULATORY_CARE_PROVIDER_SITE_OTHER): Payer: BLUE CROSS/BLUE SHIELD | Admitting: Family Medicine

## 2018-08-08 VITALS — BP 110/84 | HR 80 | Temp 98.4°F | Ht 64.0 in

## 2018-08-08 DIAGNOSIS — Z23 Encounter for immunization: Secondary | ICD-10-CM

## 2018-08-08 DIAGNOSIS — Z1211 Encounter for screening for malignant neoplasm of colon: Secondary | ICD-10-CM

## 2018-08-08 DIAGNOSIS — I10 Essential (primary) hypertension: Secondary | ICD-10-CM

## 2018-08-08 DIAGNOSIS — R232 Flushing: Secondary | ICD-10-CM

## 2018-08-08 DIAGNOSIS — N926 Irregular menstruation, unspecified: Secondary | ICD-10-CM

## 2018-08-08 DIAGNOSIS — E7800 Pure hypercholesterolemia, unspecified: Secondary | ICD-10-CM

## 2018-08-08 DIAGNOSIS — K5909 Other constipation: Secondary | ICD-10-CM

## 2018-08-08 DIAGNOSIS — Z7689 Persons encountering health services in other specified circumstances: Secondary | ICD-10-CM

## 2018-08-08 MED ORDER — ATORVASTATIN CALCIUM 40 MG PO TABS: 40.0000 mg | ORAL_TABLET | Freq: Every day | ORAL | 3 refills | Status: DC

## 2018-08-08 MED ORDER — LINACLOTIDE 290 MCG PO CAPS: 290.0000 ug | ORAL_CAPSULE | Freq: Every day | ORAL | 3 refills | Status: DC

## 2018-08-08 MED ORDER — LOSARTAN POTASSIUM-HCTZ 50-12.5 MG PO TABS: 1.0000 | ORAL_TABLET | Freq: Every day | ORAL | 3 refills | Status: DC

## 2018-08-08 NOTE — Patient Instructions (Addendum)
Perimenopause Perimenopause is the time when your body begins to move into the menopause (no menstrual period for 12 straight months). It is a natural process. Perimenopause can begin 2-8 years before the menopause and usually lasts for 1 year after the menopause. During this time, your ovaries may or may not produce an egg. The ovaries vary in their production of estrogen and progesterone hormones each month. This can cause irregular menstrual periods, difficulty getting pregnant, vaginal bleeding between periods, and uncomfortable symptoms. What are the causes?  Irregular production of the ovarian hormones, estrogen and progesterone, and not ovulating every month. Other causes include:  Tumor of the pituitary gland in the brain.  Medical disease that affects the ovaries.  Radiation treatment.  Chemotherapy.  Unknown causes.  Heavy smoking and excessive alcohol intake can bring on perimenopause sooner.  What are the signs or symptoms?  Hot flashes.  Night sweats.  Irregular menstrual periods.  Decreased sex drive.  Vaginal dryness.  Headaches.  Mood swings.  Depression.  Memory problems.  Irritability.  Tiredness.  Weight gain.  Trouble getting pregnant.  The beginning of losing bone cells (osteoporosis).  The beginning of hardening of the arteries (atherosclerosis). How is this diagnosed? Your health care provider will make a diagnosis by analyzing your age, menstrual history, and symptoms. He or she will do a physical exam and note any changes in your body, especially your female organs. Female hormone tests may or may not be helpful depending on the amount of female hormones you produce and when you produce them. However, other hormone tests may be helpful to rule out other problems. How is this treated? In some cases, no treatment is needed. The decision on whether treatment is necessary during the perimenopause should be made by you and your health care  provider based on how the symptoms are affecting you and your lifestyle. Various treatments are available, such as:  Treating individual symptoms with a specific medicine for that symptom.  Herbal medicines that can help specific symptoms.  Counseling.  Group therapy.  Follow these instructions at home:  Keep track of your menstrual periods (when they occur, how heavy they are, how long between periods, and how long they last) as well as your symptoms and when they started.  Only take over-the-counter or prescription medicines as directed by your health care provider.  Sleep and rest.  Exercise.  Eat a diet that contains calcium (good for your bones) and soy (acts like the estrogen hormone).  Do not smoke.  Avoid alcoholic beverages.  Take vitamin supplements as recommended by your health care provider. Taking vitamin E may help in certain cases.  Take calcium and vitamin D supplements to help prevent bone loss.  Group therapy is sometimes helpful.  Acupuncture may help in some cases. Contact a health care provider if:  You have questions about any symptoms you are having.  You need a referral to a specialist (gynecologist, psychiatrist, or psychologist). Get help right away if:  You have vaginal bleeding.  Your period lasts longer than 8 days.  Your periods are recurring sooner than 21 days.  You have bleeding after intercourse.  You have severe depression.  You have pain when you urinate.  You have severe headaches.  You have vision problems. This information is not intended to replace advice given to you by your health care provider. Make sure you discuss any questions you have with your health care provider. Document Released: 10/19/2004 Document Revised: 02/17/2016 Document Reviewed: 04/10/2013  Elsevier Interactive Patient Education  2017 [employer].  Colonoscopy, Adult A colonoscopy is an exam to look at the entire large intestine. During the exam,  a lubricated, bendable tube is inserted into the anus and then passed into the rectum, colon, and other parts of the large intestine. A colonoscopy is often done as a part of normal colorectal screening or in response to certain symptoms, such as anemia, persistent diarrhea, abdominal pain, and blood in the stool. The exam can help screen for and diagnose medical problems, including:  Tumors.  Polyps.  Inflammation.  Areas of bleeding.  Tell a health care provider about:  Any allergies you have.  All medicines you are taking, including vitamins, herbs, eye drops, creams, and over-the-counter medicines.  Any problems you or family members have had with anesthetic medicines.  Any blood disorders you have.  Any surgeries you have had.  Any medical conditions you have.  Any problems you have had passing stool. What are the risks? Generally, this is a safe procedure. However, problems may occur, including:  Bleeding.  A tear in the intestine.  A reaction to medicines given during the exam.  Infection (rare).  What happens before the procedure? Eating and drinking restrictions Follow instructions from your health care provider about eating and drinking, which may include:  A few days before the procedure - follow a low-fiber diet. Avoid nuts, seeds, dried fruit, raw fruits, and vegetables.  1-3 days before the procedure - follow a clear liquid diet. Drink only clear liquids, such as clear broth or bouillon, black coffee or tea, clear juice, clear soft drinks or sports drinks, gelatin dessert, and popsicles. Avoid any liquids that contain red or purple dye.  On the day of the procedure - do not eat or drink anything during the 2 hours before the procedure, or within the time period that your health care provider recommends.  Bowel prep If you were prescribed an oral bowel prep to clean out your colon:  Take it as told by your health care provider. Starting the day before  your procedure, you will need to drink a large amount of medicated liquid. The liquid will cause you to have multiple loose stools until your stool is almost clear or light green.  If your skin or anus gets irritated from diarrhea, you may use these to relieve the irritation: ? Medicated wipes, such as adult wet wipes with aloe and vitamin E. ? A skin soothing-product like petroleum jelly.  If you vomit while drinking the bowel prep, take a break for up to 60 minutes and then begin the bowel prep again. If vomiting continues and you cannot take the bowel prep without vomiting, call your health care provider.  General instructions  Ask your health care provider about changing or stopping your regular medicines. This is especially important if you are taking diabetes medicines or blood thinners.  Plan to have someone take you home from the hospital or clinic. What happens during the procedure?  An IV tube may be inserted into one of your veins.  You will be given medicine to help you relax (sedative).  To reduce your risk of infection: ? Your health care team will wash or sanitize their hands. ? Your anal area will be washed with soap.  You will be asked to lie on your side with your knees bent.  Your health care provider will lubricate a long, thin, flexible tube. The tube will have a camera and a light on  the end.  The tube will be inserted into your anus.  The tube will be gently eased through your rectum and colon.  Air will be delivered into your colon to keep it open. You may feel some pressure or cramping.  The camera will be used to take images during the procedure.  A small tissue sample may be removed from your body to be examined under a microscope (biopsy). If any potential problems are found, the tissue will be sent to a lab for testing.  If small polyps are found, your health care provider may remove them and have them checked for cancer cells.  The tube that was  inserted into your anus will be slowly removed. The procedure may vary among health care providers and hospitals. What happens after the procedure?  Your blood pressure, heart rate, breathing rate, and blood oxygen level will be monitored until the medicines you were given have worn off.  Do not drive for 24 hours after the exam.  You may have a small amount of blood in your stool.  You may pass gas and have mild abdominal cramping or bloating due to the air that was used to inflate your colon during the exam.  It is up to you to get the results of your procedure. Ask your health care provider, or the department performing the procedure, when your results will be ready. This information is not intended to replace advice given to you by your health care provider. Make sure you discuss any questions you have with your health care provider. Document Released: 09/08/2000 Document Revised: 07/12/2016 Document Reviewed: 11/23/2015 Elsevier Interactive Patient Education  2018 [employer].

## 2018-08-08 NOTE — Progress Notes (Signed)
Patient presents to clinic today to establish care.  SUBJECTIVE: PMH: Pt is a 52 yo female with pmh sig for HTN, HLD, constipation.  Pt was previously seen by Nyra Market, PA.  HTN -Taking losartan-hydrochlorothiazide 50-12.5 mg daily -Denies muscle cramps  HLD: -Taking Lipitor 40 mg daily -Denies myalgias  Constipation: -Chronic -Has tried MiraLAX, increasing fiber intake, increasing p.o. intake of water -Linzess helps but is expensive. -Has not seen GI or had colonoscopy  Change in menses: -Typically q 35-38 days -Now q. 26 days -Also notes hot flashes  Allergies:  Tylenol-lip swelling Aspirin-lip swelling  Past surgical history: D&C  Past Medical History:  Diagnosis Date  . Hyperlipidemia   . Hypertension     Past Surgical History:  Procedure Laterality Date  . DILATION AND CURETTAGE OF UTERUS      Current Outpatient Medications on File Prior to Visit  Medication Sig Dispense Refill  . fish oil-omega-3 fatty acids 1000 MG capsule Take 1 g by mouth daily.      . MULTIPLE VITAMIN PO Take by mouth.       No current facility-administered medications on file prior to visit.     Allergies  Allergen Reactions  . Acetaminophen     REACTION: SWELLING  . Aspirin     REACTION: SWELLING  . Norvasc [Amlodipine Besylate]     Swelling.    Family History  Problem Relation Age of Onset  . Hypertension Mother   . Hypertension Father   . Breast cancer Neg Hx     Social History   Socioeconomic History  . Marital status: Married    Spouse name: Not on file  . Number of children: 3  . Years of education: Not on file  . Highest education level: Not on file  Occupational History  . Not on file  Social Needs  . Financial resource strain: Not on file  . Food insecurity:    Worry: Not on file    Inability: Not on file  . Transportation needs:    Medical: Not on file    Non-medical: Not on file  Tobacco Use  . Smoking status: Never Smoker  .  Smokeless tobacco: Never Used  Substance and Sexual Activity  . Alcohol use: Yes    Alcohol/week: 2.0 standard drinks    Types: 2 Standard drinks or equivalent per week  . Drug use: No  . Sexual activity: Yes    Partners: Male  Lifestyle  . Physical activity:    Days per week: Not on file    Minutes per session: Not on file  . Stress: Not on file  Relationships  . Social connections:    Talks on phone: Not on file    Gets together: Not on file    Attends religious service: Not on file    Active member of club or organization: Not on file    Attends meetings of clubs or organizations: Not on file    Relationship status: Not on file  . Intimate partner violence:    Fear of current or ex partner: Not on file    Emotionally abused: Not on file    Physically abused: Not on file    Forced sexual activity: Not on file  Other Topics Concern  . 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     ROS General: Denies fever, chills, night sweats, changes in weight, changes in appetite  +  hot flashes HEENT: Denies headaches, ear pain, changes in vision, rhinorrhea, sore throat CV: Denies CP, palpitations, SOB, orthopnea Pulm: Denies SOB, cough, wheezing GI: Denies abdominal pain, nausea, vomiting, diarrhea  + constipation GU: Denies dysuria, hematuria, frequency, vaginal discharge  +shorter duration between menses. Msk: Denies muscle cramps, joint pains Neuro: Denies weakness, numbness, tingling Skin: Denies rashes, bruising Psych: Denies depression, anxiety, hallucinations   BP 110/84 (BP Location: Left Arm, Patient Position: Sitting, Cuff Size: Normal)   Pulse 80   Temp 98.4 F (36.9 C)   Ht 5\' 4"  (1.626 m)   SpO2 98%   BMI 31.24 kg/m   Physical Exam Gen. Pleasant, well developed, well-nourished, in NAD HEENT - [place]/AT, PERRL, no scleral icterus, no nasal drainage, pharynx without erythema or exudate. Lungs: no use of  accessory muscles, CTAB, no wheezes, rales or rhonchi Cardiovascular: RRR,No r/g/m, no peripheral edema Abdomen: BS present, soft, nontender,nondistended Neuro:  A&Ox3, CN II-XII intact, normal gait Skin:  Warm, dry, intact, no lesions  No results found for this or any previous visit (from the past 2160 hour(s)).  Assessment/Plan: Chronic constipation -Discussed increasing p.o. intake of water and vegetables -Given handout - Plan: Ambulatory referral to Gastroenterology, linaclotide (LINZESS) 290 MCG CAPS capsule  Hot flashes -Continue to monitor -Given handout on perimenopause -Consider black cohosh  Essential hypertension, benign - Plan: losartan-hydrochlorothiazide (HYZAAR) 50-12.5 MG tablet  Irregular menses -We will continue to monitor -Given handout regarding perimenopause  Pure hypercholesterolemia  -last lipid panel 06/13/17 total cholesterol 167, LDL 100, HDL 39, triglycerides 180 -Discussed lifestyle modification - Plan: atorvastatin (LIPITOR) 40 MG tablet  Need for immunization against influenza  - Plan: Flu Vaccine QUAD 36+ mos IM  Colon cancer screening  - Plan: Ambulatory referral to Gastroenterology  Encounter to establish care -We reviewed the PMH, PSH, FH, SH, Meds and Allergies. -We provided refills for any medications we will prescribe as needed. -We addressed current concerns per orders and patient instructions. -We have asked for records for pertinent exams, studies, vaccines and notes from previous providers. -We have advised patient to follow up per instructions below.  F/u in 3 months  Grier Mitts, MD

## 2018-08-12 ENCOUNTER — Encounter: Payer: Self-pay | Admitting: Family Medicine

## 2018-08-20 ENCOUNTER — Telehealth: Payer: Self-pay

## 2018-08-20 NOTE — Telephone Encounter (Signed)
Pt Rx Assistant Program form has been received, placed in Dr Volanda Napoleon folder for completing

## 2018-08-27 ENCOUNTER — Telehealth: Payer: Self-pay

## 2018-08-27 ENCOUNTER — Encounter: Payer: Self-pay | Admitting: Gastroenterology

## 2018-08-27 NOTE — Telephone Encounter (Signed)
Pt Assistant program form is complete and has been faxed as requested

## 2018-08-27 NOTE — Telephone Encounter (Signed)
Spoke with pt regarding the paperwork aware  That paperwork has been faxed as requested

## 2018-09-13 ENCOUNTER — Telehealth: Payer: Self-pay

## 2018-09-13 NOTE — Telephone Encounter (Signed)
Called pt left a message to return my call in the office regarding her [place] shipment. Advised pt to pick up the Rx in the office

## 2018-09-26 ENCOUNTER — Encounter: Payer: Self-pay | Admitting: Gastroenterology

## 2018-09-26 ENCOUNTER — Ambulatory Visit (INDEPENDENT_AMBULATORY_CARE_PROVIDER_SITE_OTHER): Payer: BLUE CROSS/BLUE SHIELD | Admitting: Gastroenterology

## 2018-09-26 VITALS — BP 118/80 | HR 96 | Ht 64.25 in | Wt 187.1 lb

## 2018-09-26 DIAGNOSIS — K5909 Other constipation: Secondary | ICD-10-CM

## 2018-09-26 DIAGNOSIS — Z1211 Encounter for screening for malignant neoplasm of colon: Secondary | ICD-10-CM

## 2018-09-26 MED ORDER — SUPREP BOWEL PREP KIT 17.5-3.13-1.6 GM/177ML PO SOLN: ORAL | 0 refills | Status: DC

## 2018-09-26 MED ORDER — PLECANATIDE 3 MG PO TABS: 3.0000 mg | ORAL_TABLET | Freq: Every day | ORAL | 0 refills | Status: DC

## 2018-09-26 MED ORDER — PRUCALOPRIDE SUCCINATE 2 MG PO TABS: 2.0000 mg | ORAL_TABLET | Freq: Every day | ORAL | 0 refills | Status: DC

## 2018-09-26 NOTE — Patient Instructions (Addendum)
If you are age 51 or older, your body mass index should be between 23-30. Your Body mass index is 31.87 kg/m. If this is out of the aforementioned range listed, please consider follow up with your Primary Care Provider.  If you are age 45 or younger, your body mass index should be between 19-25. Your Body mass index is 31.87 kg/m. If this is out of the aformentioned range listed, please consider follow up with your Primary Care Provider.   You have been scheduled for a colonoscopy. Please follow written instructions given to you at your visit today.  Please pick up your prep supplies at the pharmacy within the next 1-3 days. If you use inhalers (even only as needed), please bring them with you on the day of your procedure. Your physician has requested that you go to www.startemmi.com and enter the access code given to you at your visit today. This web site gives a general overview about your procedure. However, you should still follow specific instructions given to you by our office regarding your preparation for the procedure.  We have given you samples of the following medication to take: Linzess 290 mcg  Trulance 3mg : Take once a day for 1 week.  If this is not helpful you can try: Motegrity 2mg : Take once a day for 1 week.  Let us know if either one of these are helpful and we can send you in a prescription.  Thank you for entrusting me with your care and for choosing [facility], Dr. Ruffin Cellar

## 2018-09-26 NOTE — Progress Notes (Signed)
HPI :  53 y/o female with a history of HLD, HTN, and chronic constipation, referred here for a new patient visit by Billie Ruddy, MD for constipation and colon cancer screening.  She's experienced chronic constipation, for as long as she can remember, the better part of her life. If she does not take any laxatives she will have a bowel movement on average once per week. She feels very bloated and has discomfort if she is going to the bathroom this infrequently. She has some mild straining if she does not use anything, but no blood in her stools. She has had some issues with hemorrhoids associated with this when pregnant with her child. She is a been on a variety of over-the-counter regimens in the past, none of which have provided any significant benefit. She has tried high-dose MiraLAX which she states did not work very well. She has been on Linzess 290 g as needed for the past 2 or 3 years. She reports her insurance does not cover this well and is extremely expensive. She takes on average 2 times a week in order to help her have a bowel movement. She does report that this reliably provide some benefit. She has tried Amitiza n the past and did not think it worked as well. She has not tried Hotel manager.   She denies any family history of colon cancer. She's never had a prior colonoscopy. Otherwise she is quite healthy and has no other complaints today.   Past Medical History:  Diagnosis Date  . Constipation   . Hyperlipidemia   . Hypertension      Past Surgical History:  Procedure Laterality Date  . DILATION AND CURETTAGE OF UTERUS     Family History  Problem Relation Age of Onset  . Hypertension Mother   . Hyperlipidemia Mother   . Hypertension Father   . Cancer Father        sarcoma  . Hyperlipidemia Father   . Stroke Father   . Breast cancer Neg Hx    Social History   Tobacco Use  . Smoking status: Never Smoker  . Smokeless tobacco: Never Used  Substance Use  Topics  . Alcohol use: Yes    Alcohol/week: 2.0 standard drinks    Types: 2 Standard drinks or equivalent per week    Comment: 3/week  . Drug use: No   Current Outpatient Medications  Medication Sig Dispense Refill  . atorvastatin (LIPITOR) 40 MG tablet Take 1 tablet (40 mg total) by mouth daily. 90 tablet 3  . fish oil-omega-3 fatty acids 1000 MG capsule Take 1 g by mouth daily.      Mar[place] Kitchen linaclotide (LINZESS) 290 MCG CAPS capsule Take 1 capsule (290 mcg total) by mouth daily before breakfast. 90 capsule 3  . losartan-hydrochlorothiazide (HYZAAR) 50-12.5 MG tablet Take 1 tablet by mouth daily. 90 tablet 3  . MULTIPLE VITAMIN PO Take by mouth.      . Plecanatide (TRULANCE) 3 MG TABS Take 3 mg by mouth daily. 7 tablet 0  . Prucalopride Succinate (MOTEGRITY) 2 MG TABS Take 2 mg by mouth daily. 7 tablet 0  . SUPREP BOWEL PREP KIT 17.5-3.13-1.6 GM/177ML SOLN Suprep-Use as directed 354 mL 0   No current facility-administered medications for this visit.    Allergies  Allergen Reactions  . Aspirin Swelling    REACTION: SWELLING  . Norvasc [Amlodipine Besylate] Swelling    Swelling. feet  . Tylenol [Acetaminophen] Swelling    REACTION: SWELLING  Review of Systems: All systems reviewed and negative except where noted in HPI.   Lab Results  Component Value Date   WBC 8.6 06/12/2017   HGB 12.9 06/12/2017   HCT 38.5 06/12/2017   MCV 90.6 06/12/2017   PLT 291 06/12/2017    Lab Results  Component Value Date   CREATININE 0.81 06/12/2017   BUN 10 06/12/2017   NA 140 06/12/2017   K 3.6 06/12/2017   CL 104 06/12/2017   CO2 27 06/12/2017    Lab Results  Component Value Date   ALT 22 06/12/2017   AST 16 06/12/2017   ALKPHOS 63 06/20/2016   BILITOT 0.9 06/12/2017     Physical Exam: BP 118/80 (BP Location: Left Arm, Patient Position: Sitting, Cuff Size: Normal)   Pulse 96   Ht 5' 4.25" (1.632 m) Comment: height measured without shoes  Wt 187 lb 2 oz (84.9 kg)   LMP  09/12/2018   BMI 31.87 kg/m  Constitutional: Pleasant,well-developed, female in no acute distress. HEENT: Normocephalic and atraumatic. Conjunctivae are normal. No scleral icterus. Neck supple.  Cardiovascular: Normal rate, regular rhythm.  Pulmonary/chest: Effort normal and breath sounds normal. No wheezing, rales or rhonchi. Abdominal: Soft, nondistended, nontender. There are no masses palpable. No hepatomegaly. Extremities: no edema Lymphadenopathy: No cervical adenopathy noted. Neurological: Alert and oriented to person place and time. Skin: Skin is warm and dry. No rashes noted. Psychiatric: Normal mood and affect. Behavior is normal.   ASSESSMENT AND PLAN: 53 year old female here for new patient consultation regarding the following:  Chronic constipation - ongoing long-standing constipation as above. Discussed differential with her, suspect she has slow transit constipation. Unfortunately she has not done well with most over-the-counter laxatives historically. High-dose MiraLAX was not successful. Linzess dosed at 290 g works quite well for her, the problem is cost, extremely expensive for 3 month supply, she uses it about twice per week which works well for her when she takes it although she thinks she would feel better if she went to the bathroom more frequently. I discussed options with her. Trulance may be a preferred option for her insurance, I was able to give her a free sample of that, and it if works, will try a prescription to see how much it would cost. I was also able to provide her a sample of [place] with a discount card and will see if that works if needed. I also provided her some free samples of Linzess that we had on hand in light of the cost of it, in case this works best for her and she wants to continue with it. I encouraged her to contact her insurance company to see if there is a preferred regimen for chronic constipation management. She has failed Amitiza in the  past. She agreed with the plan. Colonoscopy to be done as below but I do not think will find pathology to account for her symptoms.  Colon cancer screening - discussed modalities for colon cancer screening with her. Optical colonoscopy is recommended in light of her symptoms. I discussed risks and benefits of colonoscopy with her, following this discussion she wanted proceed. Further recommendations pending the results.  [place] Cellar, MD [facility]  CC: Billie Ruddy, MD

## 2018-09-29 NOTE — Telephone Encounter (Signed)
Pt should schedule a physical

## 2018-09-30 ENCOUNTER — Telehealth: Payer: Self-pay | Admitting: Gastroenterology

## 2018-09-30 NOTE — Telephone Encounter (Signed)
Pt husband states that the forms have been completed by another provider.

## 2018-09-30 NOTE — Telephone Encounter (Signed)
Rx coupon given to pharmacy for pay no more than $50. Rx came up to $50. Patient verbalizes understanding and thanks [place] for coupon.

## 2018-09-30 NOTE — Telephone Encounter (Signed)
Patient states suprep for colon on 1.14.2020 is over $100 through [facility]. Patient wanting to try [facility] or find another way to get prep cheaper. Pt had ov on 1.2.2020.

## 2018-10-03 ENCOUNTER — Encounter: Payer: Self-pay | Admitting: Gastroenterology

## 2018-10-08 ENCOUNTER — Ambulatory Visit (AMBULATORY_SURGERY_CENTER): Payer: BLUE CROSS/BLUE SHIELD | Admitting: Gastroenterology

## 2018-10-08 ENCOUNTER — Encounter: Payer: Self-pay | Admitting: Gastroenterology

## 2018-10-08 VITALS — BP 118/70 | HR 72 | Temp 98.4°F | Resp 14 | Ht 64.0 in | Wt 187.0 lb

## 2018-10-08 DIAGNOSIS — Z1211 Encounter for screening for malignant neoplasm of colon: Secondary | ICD-10-CM

## 2018-10-08 DIAGNOSIS — K5909 Other constipation: Secondary | ICD-10-CM | POA: Diagnosis not present

## 2018-10-08 MED ORDER — SODIUM CHLORIDE 0.9 % IV SOLN: 500.0000 mL | Freq: Once | INTRAVENOUS | Status: AC

## 2018-10-08 NOTE — Op Note (Signed)
[place] Patient Name: Christina Grimes Procedure Date: 10/08/2018 10:51 AM MRN: [id number] Endoscopist: Remo Lipps P. Havery Moros , MD Age: 53 Referring MD:  Date of Birth: 1966/03/04 Gender: Female Account #: [id number] Procedure:                Colonoscopy Indications:              Screening for colorectal malignant neoplasm, This                            is the patient's first colonoscopy Medicines:                Monitored Anesthesia Care Procedure:                Pre-Anesthesia Assessment:                           - Prior to the procedure, a History and Physical                            was performed, and patient medications and                            allergies were reviewed. The patient's tolerance of                            previous anesthesia was also reviewed. The risks                            and benefits of the procedure and the sedation                            options and risks were discussed with the patient.                            All questions were answered, and informed consent                            was obtained. Prior Anticoagulants: The patient has                            taken no previous anticoagulant or antiplatelet                            agents. ASA Grade Assessment: II - A patient with                            mild systemic disease. After reviewing the risks                            and benefits, the patient was deemed in                            satisfactory condition to undergo the procedure.  After obtaining informed consent, the colonoscope                            was passed under direct vision. Throughout the                            procedure, the patient's blood pressure, pulse, and                            oxygen saturations were monitored continuously. The                            Model PCF-H190DL [id number]) scope was introduced                            through the anus  and advanced to the the terminal                            ileum, with identification of the appendiceal                            orifice and IC valve. The colonoscopy was performed                            without difficulty. The patient tolerated the                            procedure well. The quality of the bowel                            preparation was good. The terminal ileum, ileocecal                            valve, appendiceal orifice, and rectum were                            photographed. Scope In: 11:01:16 AM Scope Out: 11:15:37 AM Scope Withdrawal Time: 0 hours 12 minutes 10 seconds  Total Procedure Duration: 0 hours 14 minutes 21 seconds  Findings:                 The perianal and digital rectal examinations were                            normal.                           The terminal ileum appeared normal.                           Anal papilla(e) were hypertrophied.                           The exam was otherwise without abnormality. No  polyps. Complications:            No immediate complications. Estimated blood loss:                            None. Estimated Blood Loss:     Estimated blood loss: none. Impression:               - The examined portion of the ileum was normal.                           - Anal papilla(e) were hypertrophied.                           - The examination was otherwise normal.                           - No specimens collected. Recommendation:           - Patient has a contact number available for                            emergencies. The signs and symptoms of potential                            delayed complications were discussed with the                            patient. Return to normal activities tomorrow.                            Written discharge instructions were provided to the                            patient.                           - Resume previous diet.                            - Continue present medications.                           - Repeat colonoscopy in 10 years for screening                            purposes. Remo Lipps P. Armbruster, MD 10/08/2018 11:18:45 AM This report has been signed electronically.

## 2018-10-08 NOTE — Progress Notes (Signed)
A/ox3, pleased with MAC, report to RN 

## 2018-10-08 NOTE — Progress Notes (Signed)
Pt's states no medical or surgical changes since previsit or office visit. 

## 2018-10-08 NOTE — Patient Instructions (Signed)
YOU HAD AN ENDOSCOPIC PROCEDURE TODAY AT THE [facility]:   Refer to the procedure report that was given to you for any specific questions about what was found during the examination.  If the procedure report does not answer your questions, please call your gastroenterologist to clarify.  If you requested that your care partner not be given the details of your procedure findings, then the procedure report has been included in a sealed envelope for you to review at your convenience later.  YOU SHOULD EXPECT: Some feelings of bloating in the abdomen. Passage of more gas than usual.  Walking can help get rid of the air that was put into your GI tract during the procedure and reduce the bloating. If you had a lower endoscopy (such as a colonoscopy or flexible sigmoidoscopy) you may notice spotting of blood in your stool or on the toilet paper. If you underwent a bowel prep for your procedure, you may not have a normal bowel movement for a few days.  Please Note:  You might notice some irritation and congestion in your nose or some drainage.  This is from the oxygen used during your procedure.  There is no need for concern and it should clear up in a day or so.  SYMPTOMS TO REPORT IMMEDIATELY:   Following lower endoscopy (colonoscopy or flexible sigmoidoscopy):  Excessive amounts of blood in the stool  Significant tenderness or worsening of abdominal pains  Swelling of the abdomen that is new, acute  Fever of 100F or higher  For urgent or emergent issues, a gastroenterologist can be reached at any hour by calling [phone].   DIET:  We do recommend a small meal at first, but then you may proceed to your regular diet.  Drink plenty of fluids but you should avoid alcoholic beverages for 24 hours.  MEDICATIONS:  Continue present medications.  ACTIVITY:  You should plan to take it easy for the rest of today and you should NOT DRIVE or use heavy machinery until tomorrow (because of the  sedation medicines used during the test).    FOLLOW UP: Our staff will call the number listed on your records the next business day following your procedure to check on you and address any questions or concerns that you may have regarding the information given to you following your procedure. If we do not reach you, we will leave a message.  However, if you are feeling well and you are not experiencing any problems, there is no need to return our call.  We will assume that you have returned to your regular daily activities without incident.  If any biopsies were taken you will be contacted by phone or by letter within the next 1-3 weeks.  Please call us at [phone] if you have not heard about the biopsies in 3 weeks.   Thank you for allowing us to provide for your healthcare needs today.   SIGNATURES/CONFIDENTIALITY: You and/or your care partner have signed paperwork which will be entered into your electronic medical record.  These signatures attest to the fact that that the information above on your After Visit Summary has been reviewed and is understood.  Full responsibility of the confidentiality of this discharge information lies with you and/or your care-partner. 

## 2018-10-09 ENCOUNTER — Telehealth: Payer: Self-pay

## 2018-10-09 NOTE — Telephone Encounter (Signed)
  Follow up Call-  Call back number 10/08/2018  Post procedure Call Back phone  # [phone]  Permission to leave phone message Yes  Some recent data might be hidden     Patient questions:  Do you have a fever, pain , or abdominal swelling? No. Pain Score  0 *  Have you tolerated food without any problems? Yes.    Have you been able to return to your normal activities? Yes.    Do you have any questions about your discharge instructions: Diet   No. Medications  No. Follow up visit  No.  Do you have questions or concerns about your Care? No.  Actions: * If pain score is 4 or above: No action needed, pain <4.  No problems noted per pt. maw

## 2018-11-26 ENCOUNTER — Other Ambulatory Visit: Payer: Self-pay | Admitting: Family Medicine

## 2018-11-26 DIAGNOSIS — Z1231 Encounter for screening mammogram for malignant neoplasm of breast: Secondary | ICD-10-CM

## 2019-01-06 ENCOUNTER — Telehealth: Payer: BLUE CROSS/BLUE SHIELD | Admitting: Physician Assistant

## 2019-01-06 ENCOUNTER — Encounter: Payer: Self-pay | Admitting: Physician Assistant

## 2019-01-06 ENCOUNTER — Telehealth: Payer: Self-pay | Admitting: Family Medicine

## 2019-01-06 DIAGNOSIS — R319 Hematuria, unspecified: Secondary | ICD-10-CM | POA: Diagnosis not present

## 2019-01-06 DIAGNOSIS — N390 Urinary tract infection, site not specified: Secondary | ICD-10-CM | POA: Diagnosis not present

## 2019-01-06 MED ORDER — NITROFURANTOIN MONOHYD MACRO 100 MG PO CAPS: 100.0000 mg | ORAL_CAPSULE | Freq: Two times a day (BID) | ORAL | 5 refills | Status: DC

## 2019-01-06 NOTE — Progress Notes (Signed)
We are sorry that you are not feeling well.  Here is how we plan to help!  Based on what you shared with me it looks like you most likely have a simple urinary tract infection.  A UTI (Urinary Tract Infection) is a bacterial infection of the bladder.  Most cases of urinary tract infections are simple to treat but a key part of your care is to encourage you to drink plenty of fluids and watch your symptoms carefully.  I have prescribed MacroBid 100 mg twice a day for 5 days.  Your symptoms should gradually improve. Call us if the burning in your urine worsens, you develop worsening fever, back pain or pelvic pain or if your symptoms do not resolve after completing the antibiotic.  Urinary tract infections can be prevented by drinking plenty of water to keep your body hydrated.  Also be sure when you wipe, wipe from front to back and don't hold it in!  If possible, empty your bladder every 4 hours.  Your e-visit answers were reviewed by a board certified advanced clinical practitioner to complete your personal care plan.  Depending on the condition, your plan could have included both over the counter or prescription medications.  If there is a problem please reply  once you have received a response from your provider.  Your safety is important to [place].  If you have drug allergies check your prescription carefully.    You can use MyChart to ask questions about today's visit, request a non-urgent call back, or ask for a work or school excuse for 24 hours related to this e-Visit. If it has been greater than 24 hours you will need to follow up with your provider, or enter a new e-Visit to address those concerns.   You will get an e-mail in the next two days asking about your experience.  I hope that your e-visit has been valuable and will speed your recovery. Thank you for using e-visits.   I have spent 7 min in completion and review of this note- Lacy Duverney [facility]

## 2019-01-06 NOTE — Telephone Encounter (Signed)
Pt had a virtual web visit this morning pt states that she already got her Antibiotic.

## 2019-01-06 NOTE — Telephone Encounter (Signed)
Copied from [place]. Topic: Quick Communication - See Telephone Encounter >> Jan 06, 2019  7:42 AM Robina Ade, Helene Kelp D wrote: CRM for notification. See Telephone encounter for: 01/06/19. Patient called and would like a call back form Dr. Volanda Napoleon CMA regarding a possible UTI and would like something called in to her pharmacy.

## 2019-01-07 ENCOUNTER — Ambulatory Visit: Payer: BLUE CROSS/BLUE SHIELD

## 2019-01-29 DIAGNOSIS — D225 Melanocytic nevi of trunk: Secondary | ICD-10-CM | POA: Diagnosis not present

## 2019-01-29 DIAGNOSIS — D1801 Hemangioma of skin and subcutaneous tissue: Secondary | ICD-10-CM | POA: Diagnosis not present

## 2019-01-29 DIAGNOSIS — Z85828 Personal history of other malignant neoplasm of skin: Secondary | ICD-10-CM | POA: Diagnosis not present

## 2019-01-29 DIAGNOSIS — L821 Other seborrheic keratosis: Secondary | ICD-10-CM | POA: Diagnosis not present

## 2019-01-29 DIAGNOSIS — C44712 Basal cell carcinoma of skin of right lower limb, including hip: Secondary | ICD-10-CM | POA: Diagnosis not present

## 2019-03-14 ENCOUNTER — Ambulatory Visit
Admission: RE | Admit: 2019-03-14 | Discharge: 2019-03-14 | Disposition: A | Discharge status: Home / Self Care | Payer: BC Managed Care – PPO | Source: Ambulatory Visit | Attending: Family Medicine | Admitting: Family Medicine

## 2019-03-14 ENCOUNTER — Other Ambulatory Visit: Payer: Self-pay

## 2019-03-14 DIAGNOSIS — Z1231 Encounter for screening mammogram for malignant neoplasm of breast: Secondary | ICD-10-CM | POA: Diagnosis not present

## 2019-08-05 DIAGNOSIS — Z85828 Personal history of other malignant neoplasm of skin: Secondary | ICD-10-CM | POA: Diagnosis not present

## 2019-08-05 DIAGNOSIS — L821 Other seborrheic keratosis: Secondary | ICD-10-CM | POA: Diagnosis not present

## 2019-08-05 DIAGNOSIS — L814 Other melanin hyperpigmentation: Secondary | ICD-10-CM | POA: Diagnosis not present

## 2019-08-05 DIAGNOSIS — L570 Actinic keratosis: Secondary | ICD-10-CM | POA: Diagnosis not present

## 2019-08-05 DIAGNOSIS — D2261 Melanocytic nevi of right upper limb, including shoulder: Secondary | ICD-10-CM | POA: Diagnosis not present

## 2019-09-24 ENCOUNTER — Ambulatory Visit (INDEPENDENT_AMBULATORY_CARE_PROVIDER_SITE_OTHER): Payer: BC Managed Care – PPO | Admitting: Family Medicine

## 2019-09-24 ENCOUNTER — Encounter: Payer: Self-pay | Admitting: Family Medicine

## 2019-09-24 ENCOUNTER — Other Ambulatory Visit: Payer: Self-pay

## 2019-09-24 ENCOUNTER — Other Ambulatory Visit (HOSPITAL_COMMUNITY)
Admission: RE | Admit: 2019-09-24 | Discharge: 2019-09-24 | Disposition: A | Discharge status: Home / Self Care | Payer: BC Managed Care – PPO | Source: Ambulatory Visit | Attending: Family Medicine | Admitting: Family Medicine

## 2019-09-24 VITALS — BP 122/88 | HR 84 | Temp 97.8°F | Wt 188.0 lb

## 2019-09-24 DIAGNOSIS — E781 Pure hyperglyceridemia: Secondary | ICD-10-CM

## 2019-09-24 DIAGNOSIS — Z124 Encounter for screening for malignant neoplasm of cervix: Secondary | ICD-10-CM | POA: Insufficient documentation

## 2019-09-24 DIAGNOSIS — Z0000 Encounter for general adult medical examination without abnormal findings: Secondary | ICD-10-CM | POA: Diagnosis not present

## 2019-09-24 DIAGNOSIS — Z23 Encounter for immunization: Secondary | ICD-10-CM | POA: Diagnosis not present

## 2019-09-24 DIAGNOSIS — Z6832 Body mass index (BMI) 32.0-32.9, adult: Secondary | ICD-10-CM

## 2019-09-24 LAB — CBC WITH DIFFERENTIAL/PLATELET
Basophils Absolute: 0 10*3/uL (ref 0.0–0.1)
Basophils Relative: 0.5 % (ref 0.0–3.0)
Eosinophils Absolute: 0.2 10*3/uL (ref 0.0–0.7)
Eosinophils Relative: 1.6 % (ref 0.0–5.0)
HCT: 38.5 % (ref 36.0–46.0)
Hemoglobin: 12.9 g/dL (ref 12.0–15.0)
Lymphocytes Relative: 29.3 % (ref 12.0–46.0)
Lymphs Abs: 2.9 10*3/uL (ref 0.7–4.0)
MCHC: 33.4 g/dL (ref 30.0–36.0)
MCV: 90.9 fl (ref 78.0–100.0)
Monocytes Absolute: 0.5 10*3/uL (ref 0.1–1.0)
Monocytes Relative: 5.4 % (ref 3.0–12.0)
Neutro Abs: 6.2 10*3/uL (ref 1.4–7.7)
Neutrophils Relative %: 63.2 % (ref 43.0–77.0)
Platelets: 305 10*3/uL (ref 150.0–400.0)
RBC: 4.23 Mil/uL (ref 3.87–5.11)
RDW: 13.4 % (ref 11.5–15.5)
WBC: 9.8 10*3/uL (ref 4.0–10.5)

## 2019-09-24 LAB — BASIC METABOLIC PANEL
BUN: 13 mg/dL (ref 6–23)
CO2: 26 mEq/L (ref 19–32)
Calcium: 9.5 mg/dL (ref 8.4–10.5)
Chloride: 103 mEq/L (ref 96–112)
Creatinine, Ser: 0.76 mg/dL (ref 0.40–1.20)
GFR: 80.11 mL/min (ref >60.00)
Glucose, Bld: 95 mg/dL (ref 70–99)
Potassium: 3.6 mEq/L (ref 3.5–5.1)
Sodium: 139 mEq/L (ref 135–145)

## 2019-09-24 LAB — LIPID PANEL
Cholesterol: 172 mg/dL (ref 0–200)
HDL: 37 mg/dL — ABNORMAL LOW (ref >39.00)
NonHDL: 135.1
Total CHOL/HDL Ratio: 5
Triglycerides: 233 mg/dL — ABNORMAL HIGH (ref 0.0–149.0)
VLDL: 46.6 mg/dL — ABNORMAL HIGH (ref 0.0–40.0)

## 2019-09-24 LAB — TSH: TSH: 4.54 u[IU]/mL — ABNORMAL HIGH (ref 0.35–4.50)

## 2019-09-24 LAB — HEMOGLOBIN A1C: Hgb A1c MFr Bld: 5.8 % (ref 4.6–6.5)

## 2019-09-24 LAB — LDL CHOLESTEROL, DIRECT: Direct LDL: 95 mg/dL

## 2019-09-24 NOTE — Patient Instructions (Signed)

## 2019-09-24 NOTE — Progress Notes (Signed)
Subjective:     Christina Grimes is a 53 y.o. female and is here for a comprehensive physical exam. The patient reports no problems.  Pt trying to stay healthy during COVID-19 pandemic.  Pt's kids are home from college.  Mammogram done 03/14/2019.  Pap done 05/03/16, normal in the past.  Pt concerned as her husband has a h/o pharyngeal HPV.  Colonoscopy done 10/08/2018.  Seen by Dermatology for removal of skin lesions.  Social History   Socioeconomic History  . Marital status: Married    Spouse name: Not on file  . Number of children: 3  . Years of education: Not on file  . Highest education level: Not on file  Occupational History  . Not on file  Tobacco Use  . Smoking status: Never Smoker  . Smokeless tobacco: Never Used  Substance and Sexual Activity  . Alcohol use: Yes    Alcohol/week: 2.0 standard drinks    Types: 2 Standard drinks or equivalent per week    Comment: 3/week  . Drug use: No  . Sexual activity: Yes    Partners: Male  Other Topics Concern  . 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    Social Determinants of Health   Financial Resource Strain:   . Difficulty of Paying Living Expenses: Not on file  Food Insecurity:   . Worried About Charity fundraiser in the Last Year: Not on file  . Ran Out of Food in the Last Year: Not on file  Transportation Needs:   . Lack of Transportation (Medical): Not on file  . Lack of Transportation (Non-Medical): Not on file  Physical Activity:   . Days of Exercise per Week: Not on file  . Minutes of Exercise per Session: Not on file  Stress:   . Feeling of Stress : Not on file  Social Connections:   . Frequency of Communication with Friends and Family: Not on file  . Frequency of Social Gatherings with Friends and Family: Not on file  . Attends Religious Services: Not on file  . Active Member of Clubs or Organizations: Not on file  . Attends Theatre manager Meetings: Not on file  . Marital Status: Not on file  Intimate Partner Violence:   . Fear of Current or Ex-Partner: Not on file  . Emotionally Abused: Not on file  . Physically Abused: Not on file  . Sexually Abused: Not on file   Health Maintenance  Topic Date Due  . INFLUENZA VACCINE  04/26/2019  . PAP SMEAR-Modifier  05/04/2019  . MAMMOGRAM  03/13/2020  . TETANUS/TDAP  06/13/2027  . COLONOSCOPY  10/08/2028  . HIV Screening  Completed    The following portions of the patient's history were reviewed and updated as appropriate: allergies, current medications, past family history, past medical history, past social history, past surgical history and problem list.  Review of Systems A comprehensive review of systems was negative.   Objective:    BP 122/88 (BP Location: Left Arm, Patient Position: Sitting, Cuff Size: Large)   Pulse 84   Temp 97.8 F (36.6 C) (Temporal)   Wt 188 lb (85.3 kg)   SpO2 99%   BMI 32.27 kg/m  General appearance: alert, cooperative and no distress Head: Normocephalic, without obvious abnormality, atraumatic Eyes: conjunctivae/corneas clear. PERRL, EOM's intact. Fundi benign. Ears: normal TM's and external ear canals both ears Nose: Nares normal. Septum midline. Mucosa normal.  No drainage or sinus tenderness. Throat: lips, mucosa, and tongue normal; teeth and gums normal Neck: no adenopathy, no carotid bruit, no JVD, supple, symmetrical, trachea midline and thyroid not enlarged, symmetric, no tenderness/mass/nodules Lungs: clear to auscultation bilaterally Heart: regular rate and rhythm, S1, S2 normal, no murmur, click, rub or gallop Abdomen: soft, non-tender; bowel sounds normal; no masses,  no organomegaly Pelvic: cervix normal in appearance, external genitalia normal, no adnexal masses or tenderness, no cervical motion tenderness, uterus normal size, shape, and consistency and scant dark brown blood at os Extremities: extremities  normal, atraumatic, no cyanosis or edema Pulses: 2+ and symmetric Skin: Skin color, texture, turgor normal. No rashes or lesions Lymph nodes: Cervical, supraclavicular, and axillary nodes normal. Neurologic: Alert and oriented X 3, normal strength and tone. Normal symmetric reflexes. Normal coordination and gait    Assessment:    Healthy female exam.      Plan:     Anticipatory guidance given including wearing seatbelts, smoke detectors in the home, increasing physical activity, increasing p.o. intake of water and vegetables. -will obtain labs -pap done this visit -mammogram done 03/14/2019 -influenza vaccine given this visit -given handouts -next CPE in 1 yr See After Visit Summary for Counseling Recommendations    Hypertriglyceidemia - Plan: Lipid Panel  Cervical cancer screening  - Plan: PAP [[place]]  BMI 32.0-32.9,adult  -lifestyle modifications - Plan: Hemoglobin A1c, TSH  Need for immunization against influenza -influenza vaccine given this visit  F/u prn  Grier Mitts, MD

## 2019-09-30 LAB — CYTOLOGY - PAP: Diagnosis: NEGATIVE

## 2019-10-01 ENCOUNTER — Encounter: Payer: Self-pay | Admitting: Family Medicine

## 2019-10-01 ENCOUNTER — Other Ambulatory Visit: Payer: Self-pay

## 2019-10-01 DIAGNOSIS — I10 Essential (primary) hypertension: Secondary | ICD-10-CM

## 2019-10-01 DIAGNOSIS — E7800 Pure hypercholesterolemia, unspecified: Secondary | ICD-10-CM

## 2019-10-01 MED ORDER — ATORVASTATIN CALCIUM 40 MG PO TABS: 40.0000 mg | ORAL_TABLET | Freq: Every day | ORAL | 3 refills | Status: DC

## 2019-10-01 MED ORDER — LOSARTAN POTASSIUM-HCTZ 50-12.5 MG PO TABS: 1.0000 | ORAL_TABLET | Freq: Every day | ORAL | 3 refills | Status: DC

## 2019-10-03 ENCOUNTER — Encounter: Payer: Self-pay | Admitting: Family Medicine

## 2019-11-25 ENCOUNTER — Encounter: Payer: Self-pay | Admitting: Family Medicine

## 2019-11-26 ENCOUNTER — Telehealth: Payer: Self-pay

## 2019-11-26 NOTE — Telephone Encounter (Signed)
Left a message for pt to call the office.

## 2019-11-27 ENCOUNTER — Other Ambulatory Visit: Payer: Self-pay

## 2019-11-27 DIAGNOSIS — E7800 Pure hypercholesterolemia, unspecified: Secondary | ICD-10-CM

## 2019-11-27 DIAGNOSIS — K5909 Other constipation: Secondary | ICD-10-CM

## 2019-11-27 DIAGNOSIS — I10 Essential (primary) hypertension: Secondary | ICD-10-CM

## 2019-11-27 MED ORDER — LOSARTAN POTASSIUM-HCTZ 50-12.5 MG PO TABS: 1.0000 | ORAL_TABLET | Freq: Every day | ORAL | 3 refills | Status: DC

## 2019-11-27 MED ORDER — LINACLOTIDE 290 MCG PO CAPS: 290.0000 ug | ORAL_CAPSULE | Freq: Every day | ORAL | 3 refills | Status: DC

## 2019-11-27 MED ORDER — ATORVASTATIN CALCIUM 40 MG PO TABS: 40.0000 mg | ORAL_TABLET | Freq: Every day | ORAL | 3 refills | Status: DC

## 2019-11-28 ENCOUNTER — Other Ambulatory Visit: Payer: Self-pay | Admitting: Family Medicine

## 2019-11-28 ENCOUNTER — Other Ambulatory Visit: Payer: Self-pay

## 2019-11-28 DIAGNOSIS — E039 Hypothyroidism, unspecified: Secondary | ICD-10-CM | POA: Insufficient documentation

## 2019-11-28 MED ORDER — LEVOTHYROXINE SODIUM 50 MCG PO TABS: 50.0000 ug | ORAL_TABLET | Freq: Every day | ORAL | 3 refills | Status: DC

## 2019-11-28 NOTE — Telephone Encounter (Signed)
Pt returned my call in the office verbalized understanding of Dr Volanda Napoleon advise to start Synthroid 50 mcg daily before breakfast or before taking Am medications. Pt is aware to schedule for TSH recheck in 6-8 weeks

## 2019-11-28 NOTE — Telephone Encounter (Signed)
Synthroid 50 mcg daily can be started.  You would need to take it first thing in the am prior to taking other meds or eating.  You should have your TSH rechecked in 6-8 wks after starting the med.

## 2019-11-28 NOTE — Telephone Encounter (Signed)
Called pt left a voicemail for pt regarding the new Rx for synthroid sent to pt pharmacy, advised pt to  call the office with  further questions

## 2020-01-15 ENCOUNTER — Telehealth: Payer: Self-pay | Admitting: Family Medicine

## 2020-01-15 NOTE — Telephone Encounter (Signed)
Pt sent in a my-chart appointment request and stated " I only need to do bloodwork to check my thyroid as a follow up to new medication" I did try to schedule an appointment with Dr. Volanda Napoleon but the pt replied with "I don't think I need to see Dr. Volanda Napoleon, just the lab. Also, should I not eat that day before the bloodwork?"   Informed pt that I will send the note back asking for lab orders to test thyroid and will follow up with her when Dr. Volanda Napoleon has reviewed this information.   Pt can be reached at [phone]

## 2020-01-15 NOTE — Telephone Encounter (Signed)
Spoke with pt clarified to pt when she should return for recheck on her Lipids and TSH, pt state that she will call to schedule

## 2020-01-19 ENCOUNTER — Ambulatory Visit: Payer: BC Managed Care – PPO | Admitting: Family Medicine

## 2020-04-12 ENCOUNTER — Encounter: Payer: Self-pay | Admitting: Family Medicine

## 2020-04-12 ENCOUNTER — Other Ambulatory Visit: Payer: Self-pay | Admitting: Family Medicine

## 2020-04-12 DIAGNOSIS — Z1231 Encounter for screening mammogram for malignant neoplasm of breast: Secondary | ICD-10-CM

## 2020-04-20 ENCOUNTER — Other Ambulatory Visit: Payer: Self-pay

## 2020-04-20 DIAGNOSIS — R7989 Other specified abnormal findings of blood chemistry: Secondary | ICD-10-CM

## 2020-04-20 DIAGNOSIS — E781 Pure hyperglyceridemia: Secondary | ICD-10-CM

## 2020-04-21 NOTE — Telephone Encounter (Signed)
Orders already placed.

## 2020-04-26 ENCOUNTER — Other Ambulatory Visit: Payer: Self-pay

## 2020-04-26 ENCOUNTER — Ambulatory Visit
Admission: RE | Admit: 2020-04-26 | Discharge: 2020-04-26 | Disposition: A | Discharge status: Home / Self Care | Payer: BC Managed Care – PPO | Source: Ambulatory Visit | Attending: Family Medicine | Admitting: Family Medicine

## 2020-04-26 DIAGNOSIS — Z1231 Encounter for screening mammogram for malignant neoplasm of breast: Secondary | ICD-10-CM

## 2020-04-27 ENCOUNTER — Other Ambulatory Visit (INDEPENDENT_AMBULATORY_CARE_PROVIDER_SITE_OTHER): Payer: BC Managed Care – PPO

## 2020-04-27 DIAGNOSIS — R7989 Other specified abnormal findings of blood chemistry: Secondary | ICD-10-CM | POA: Diagnosis not present

## 2020-04-27 DIAGNOSIS — E781 Pure hyperglyceridemia: Secondary | ICD-10-CM | POA: Diagnosis not present

## 2020-04-27 LAB — LIPID PANEL
Cholesterol: 160 mg/dL (ref <200)
HDL: 39 mg/dL — ABNORMAL LOW (ref >=50)
LDL Cholesterol (Calc): 91 mg/dL (calc)
Non-HDL Cholesterol (Calc): 121 mg/dL (calc) (ref <130)
Total CHOL/HDL Ratio: 4.1 (calc) (ref <5.0)
Triglycerides: 206 mg/dL — ABNORMAL HIGH (ref <150)

## 2020-04-27 LAB — TSH: TSH: 5.95 mIU/L — ABNORMAL HIGH

## 2020-04-28 ENCOUNTER — Encounter: Payer: Self-pay | Admitting: Family Medicine

## 2020-04-28 ENCOUNTER — Other Ambulatory Visit: Payer: Self-pay | Admitting: Family Medicine

## 2020-04-28 DIAGNOSIS — E039 Hypothyroidism, unspecified: Secondary | ICD-10-CM

## 2020-04-28 LAB — HM MAMMOGRAPHY

## 2020-04-28 MED ORDER — LEVOTHYROXINE SODIUM 88 MCG PO TABS: 88.0000 ug | ORAL_TABLET | Freq: Every day | ORAL | 2 refills | Status: DC

## 2020-05-04 ENCOUNTER — Encounter: Payer: Self-pay | Admitting: Family Medicine

## 2020-11-29 ENCOUNTER — Encounter: Payer: Self-pay | Admitting: Family Medicine

## 2020-11-29 ENCOUNTER — Ambulatory Visit (INDEPENDENT_AMBULATORY_CARE_PROVIDER_SITE_OTHER): Payer: 59 | Admitting: Family Medicine

## 2020-11-29 ENCOUNTER — Other Ambulatory Visit: Payer: Self-pay

## 2020-11-29 VITALS — BP 120/90 | HR 86 | Temp 98.4°F | Ht 64.0 in | Wt 190.8 lb

## 2020-11-29 DIAGNOSIS — E039 Hypothyroidism, unspecified: Secondary | ICD-10-CM

## 2020-11-29 DIAGNOSIS — E7800 Pure hypercholesterolemia, unspecified: Secondary | ICD-10-CM

## 2020-11-29 DIAGNOSIS — R6882 Decreased libido: Secondary | ICD-10-CM

## 2020-11-29 DIAGNOSIS — Z0000 Encounter for general adult medical examination without abnormal findings: Secondary | ICD-10-CM | POA: Diagnosis not present

## 2020-11-29 DIAGNOSIS — I10 Essential (primary) hypertension: Secondary | ICD-10-CM

## 2020-11-29 LAB — CBC WITH DIFFERENTIAL/PLATELET
Basophils Absolute: 0 10*3/uL (ref 0.0–0.1)
Basophils Relative: 0.5 % (ref 0.0–3.0)
Eosinophils Absolute: 0.2 10*3/uL (ref 0.0–0.7)
Eosinophils Relative: 1.9 % (ref 0.0–5.0)
HCT: 39.4 % (ref 36.0–46.0)
Hemoglobin: 13.3 g/dL (ref 12.0–15.0)
Lymphocytes Relative: 30.7 % (ref 12.0–46.0)
Lymphs Abs: 2.5 10*3/uL (ref 0.7–4.0)
MCHC: 33.8 g/dL (ref 30.0–36.0)
MCV: 89.7 fl (ref 78.0–100.0)
Monocytes Absolute: 0.5 10*3/uL (ref 0.1–1.0)
Monocytes Relative: 6 % (ref 3.0–12.0)
Neutro Abs: 4.9 10*3/uL (ref 1.4–7.7)
Neutrophils Relative %: 60.9 % (ref 43.0–77.0)
Platelets: 292 10*3/uL (ref 150.0–400.0)
RBC: 4.39 Mil/uL (ref 3.87–5.11)
RDW: 14.1 % (ref 11.5–15.5)
WBC: 8.1 10*3/uL (ref 4.0–10.5)

## 2020-11-29 LAB — BASIC METABOLIC PANEL
BUN: 10 mg/dL (ref 6–23)
CO2: 27 mEq/L (ref 19–32)
Calcium: 9.6 mg/dL (ref 8.4–10.5)
Chloride: 105 mEq/L (ref 96–112)
Creatinine, Ser: 0.79 mg/dL (ref 0.40–1.20)
GFR: 85.92 mL/min (ref >60.00)
Glucose, Bld: 97 mg/dL (ref 70–99)
Potassium: 4.3 mEq/L (ref 3.5–5.1)
Sodium: 139 mEq/L (ref 135–145)

## 2020-11-29 LAB — LIPID PANEL
Cholesterol: 166 mg/dL (ref 0–200)
HDL: 43.7 mg/dL (ref >39.00)
LDL Cholesterol: 86 mg/dL (ref 0–99)
NonHDL: 122.3
Total CHOL/HDL Ratio: 4
Triglycerides: 180 mg/dL — ABNORMAL HIGH (ref 0.0–149.0)
VLDL: 36 mg/dL (ref 0.0–40.0)

## 2020-11-29 LAB — HEMOGLOBIN A1C: Hgb A1c MFr Bld: 5.9 % (ref 4.6–6.5)

## 2020-11-29 LAB — TSH: TSH: 7.83 u[IU]/mL — ABNORMAL HIGH (ref 0.35–4.50)

## 2020-11-29 LAB — T4, FREE: Free T4: 0.64 ng/dL (ref 0.60–1.60)

## 2020-11-29 MED ORDER — LOSARTAN POTASSIUM-HCTZ 50-12.5 MG PO TABS: 1.0000 | ORAL_TABLET | Freq: Every day | ORAL | 3 refills | Status: AC

## 2020-11-29 MED ORDER — ATORVASTATIN CALCIUM 40 MG PO TABS: 40.0000 mg | ORAL_TABLET | Freq: Every day | ORAL | 3 refills | Status: AC

## 2020-11-29 MED ORDER — LEVOTHYROXINE SODIUM 88 MCG PO TABS: 88.0000 ug | ORAL_TABLET | Freq: Every day | ORAL | 3 refills | Status: DC

## 2020-11-29 NOTE — Progress Notes (Signed)
Subjective:     Christina Grimes is a 55 y.o. female and is here for a comprehensive physical exam. The patient reports decreased libido x a while.  Pt denies increased stress, anxiety, or depression.  Pt states she and her husband are happy.  Denies newe meds or other changes.  Pt taking synthroid 88 mcg first thing in am prior to other meds.  BP stable on hyzaar.  Health maintenance up to date.  Social History   Socioeconomic History  . Marital status: Married    Spouse name: Not on file  . Number of children: 3  . Years of education: Not on file  . Highest education level: Not on file  Occupational History  . Not on file  Tobacco Use  . Smoking status: Never Smoker  . Smokeless tobacco: Never Used  Substance and Sexual Activity  . Alcohol use: Yes    Alcohol/week: 2.0 standard drinks    Types: 2 Standard drinks or equivalent per week    Comment: 3/week  . Drug use: No  . Sexual activity: Yes    Partners: Male  Other Topics Concern  . 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    Social Determinants of Health   Financial Resource Strain: Not on file  Food Insecurity: Not on file  Transportation Needs: Not on file  Physical Activity: Not on file  Stress: Not on file  Social Connections: Not on file  Intimate Partner Violence: Not on file   Health Maintenance  Topic Date Due  . Hepatitis C Screening  Never done  . COVID-19 Vaccine (3 - Pfizer risk 4-dose series) 01/24/2020  . INFLUENZA VACCINE  04/25/2020  . MAMMOGRAM  04/28/2021  . PAP SMEAR-Modifier  09/23/2022  . TETANUS/TDAP  06/13/2027  . COLONOSCOPY (Pts 45-11yr Insurance coverage will need to be confirmed)  10/08/2028  . HIV Screening  Completed  . HPV VACCINES  Aged Out    The following portions of the patient's history were reviewed and updated as appropriate: allergies, current medications, past family history, past medical history, past  social history, past surgical history and problem list.  Review of Systems Pertinent items noted in HPI and remainder of comprehensive ROS otherwise negative.   Objective:    BP 120/90 (BP Location: Right Arm, Patient Position: Sitting, Cuff Size: Normal)   Pulse 86   Temp 98.4 F (36.9 C) (Oral)   Ht '5\' 4"'  (1.626 m)   Wt 190 lb 12.8 oz (86.5 kg)   SpO2 99%   BMI 32.75 kg/m  General appearance: alert, cooperative and no distress Head: Normocephalic, without obvious abnormality, atraumatic Eyes: conjunctivae/corneas clear. PERRL, EOM's intact. Fundi benign. Ears: normal TM's and external ear canals both ears Nose: Nares normal. Septum midline. Mucosa normal. No drainage or sinus tenderness. Throat: lips, mucosa, and tongue normal; teeth and gums normal Neck: no adenopathy, no carotid bruit, no JVD, supple, symmetrical, trachea midline and thyroid not enlarged, symmetric, no tenderness/mass/nodules Lungs: clear to auscultation bilaterally Heart: regular rate and rhythm, S1, S2 normal, no murmur, click, rub or gallop Abdomen: soft, non-tender; bowel sounds normal; no masses,  no organomegaly Extremities: extremities normal, atraumatic, no cyanosis or edema Pulses: 2+ and symmetric Skin: Skin color, texture, turgor normal. No rashes or lesions Lymph nodes: Cervical, supraclavicular, and axillary nodes normal. Neurologic: Alert and oriented X 3, normal strength and tone. Normal symmetric reflexes. Normal coordination and gait  Assessment:    Healthy female exam with decreased libido.     Plan:     Anticipatory guidance given including wearing seatbelts, smoke detectors in the home, increasing physical activity, increasing p.o. intake of water and vegetables. -will obtain labs -mammogram done 04/28/20, repeat this yr -colonoscopy done 10/08/18.  Repeat in 2030 -pap due 2023 See After Visit Summary for Counseling Recommendations    Acquired hypothyroidism -continue synthroid 88  mcg -will adjust dose if needed based on lab results  - Plan: TSH, T4, free, levothyroxine (SYNTHROID) 88 MCG tablet  Decreased libido -discussed likely causes including perimenopause, medications, etc.   -stress, anxiety, depression, less likely contributing -PHQ 9 score 2 -GAD 7 score 2  Pure hypercholesterolemia -lifestyle modifications  - Plan: atorvastatin (LIPITOR) 40 MG tablet, Lipid panel  Essential hypertension, benign  -elevated -recheck -lifestyle modifications -continue losartan-HCTZ 50-12.5 mg daily - Plan: losartan-hydrochlorothiazide (HYZAAR) 50-12.5 MG tablet, BMP with eGFR([facility]), Basic metabolic panel, Basic metabolic panel  F/u prn  Grier Mitts, MD

## 2020-11-29 NOTE — Patient Instructions (Addendum)
Preventive Care 84-53 Years Old, Female Preventive care refers to lifestyle choices and visits with your health care provider that can promote health and wellness. This includes:  A yearly physical exam. This is also called an annual wellness visit.  Regular dental and eye exams.  Immunizations.  Screening for certain conditions.  Healthy lifestyle choices, such as: ? Eating a healthy diet. ? Getting regular exercise. ? Not using drugs or products that contain nicotine and tobacco. ? Limiting alcohol use. What can I expect for my preventive care visit? Physical exam Your health care provider will check your:  Height and weight. These may be used to calculate your BMI (body mass index). BMI is a measurement that tells if you are at a healthy weight.  Heart rate and blood pressure.  Body temperature.  Skin for abnormal spots. Counseling Your health care provider may ask you questions about your:  Past medical problems.  Family's medical history.  Alcohol, tobacco, and drug use.  Emotional well-being.  Home life and relationship well-being.  Sexual activity.  Diet, exercise, and sleep habits.  Work and work Statistician.  Access to firearms.  Method of birth control.  Menstrual cycle.  Pregnancy history. What immunizations do I need? Vaccines are usually given at various ages, according to a schedule. Your health care provider will recommend vaccines for you based on your age, medical history, and lifestyle or other factors, such as travel or where you work.   What tests do I need? Blood tests  Lipid and cholesterol levels. These may be checked every 5 years, or more often if you are over 53 years old.  Hepatitis C test.  Hepatitis B test. Screening  Lung cancer screening. You may have this screening every year starting at age 53 if you have a 30-pack-year history of smoking and currently smoke or have quit within the past 15 years.  Colorectal cancer  screening. ? All adults should have this screening starting at age 53 and continuing until age 17. ? Your health care provider may recommend screening at age 53 if you are at increased risk. ? You will have tests every 1-10 years, depending on your results and the type of screening test.  Diabetes screening. ? This is done by checking your blood sugar (glucose) after you have not eaten for a while (fasting). ? You may have this done every 1-3 years.  Mammogram. ? This may be done every 1-2 years. ? Talk with your health care provider about when you should start having regular mammograms. This may depend on whether you have a family history of breast cancer.  BRCA-related cancer screening. This may be done if you have a family history of breast, ovarian, tubal, or peritoneal cancers.  Pelvic exam and Pap test. ? This may be done every 3 years starting at age 53. ? Starting at age 11, this may be done every 5 years if you have a Pap test in combination with an HPV test. Starting at age 53 Other tests  STD (sexually transmitted disease) testing, if you are at risk.  Bone density scan. This is done to screen for osteoporosis. You may have this scan if you are at high risk for osteoporosis. Talk with your health care provider about your test results, treatment options, and if necessary, the need for more tests. Follow these instructions at home: Eating and drinking  Eat a diet that includes fresh fruits and vegetables, whole grains, lean protein, and low-fat dairy products.  Take vitamin and mineral supplements  as recommended by your health care provider.  Do not drink alcohol if: ? Your health care provider tells you not to drink. ? You are pregnant, may be pregnant, or are planning to become pregnant.  If you drink alcohol: ? Limit how much you have to 0-1 drink a day. ? Be aware of how much alcohol is in your drink. In the [place] one drink equals one 12 oz bottle of beer (355 mL), one 5 oz glass of  wine (148 mL), or one 1 oz glass of hard liquor (44 mL).   Lifestyle  Take daily care of your teeth and gums. Brush your teeth every morning and night with fluoride toothpaste. Floss one time each day.  Stay active. Exercise for at least 30 minutes 5 or more days each week.  Do not use any products that contain nicotine or tobacco, such as cigarettes, e-cigarettes, and chewing tobacco. If you need help quitting, ask your health care provider.  Do not use drugs.  If you are sexually active, practice safe sex. Use a condom or other form of protection to prevent STIs (sexually transmitted infections).  If you do not wish to become pregnant, use a form of birth control. If you plan to become pregnant, see your health care provider for a prepregnancy visit.  If told by your health care provider, take low-dose aspirin daily starting at age 53.  Find healthy ways to cope with stress, such as: ? Meditation, yoga, or listening to music. ? Journaling. ? Talking to a trusted person. ? Spending time with friends and family. Safety  Always wear your seat belt while driving or riding in a vehicle.  Do not drive: ? If you have been drinking alcohol. Do not ride with someone who has been drinking. ? When you are tired or distracted. ? While texting.  Wear a helmet and other protective equipment during sports activities.  If you have firearms in your house, make sure you follow all gun safety procedures. What's next?  Visit your health care provider once a year for an annual wellness visit.  Ask your health care provider how often you should have your eyes and teeth checked.  Stay up to date on all vaccines. This information is not intended to replace advice given to you by your health care provider. Make sure you discuss any questions you have with your health care provider. Document Revised: 06/15/2020 Document Reviewed: 05/23/2018 Elsevier Patient Education  2021 [place].  Hypothyroidism  Hypothyroidism is when the thyroid gland does not make enough of certain hormones (it is underactive). The thyroid gland is a small gland located in the lower front part of the neck, just in front of the windpipe (trachea). This gland makes hormones that help control how the body uses food for energy (metabolism) as well as how the heart and brain function. These hormones also play a role in keeping your bones strong. When the thyroid is underactive, it produces too little of the hormones thyroxine (T4) and triiodothyronine (T3). What are the causes? This condition may be caused by:  Hashimoto's disease. This is a disease in which the body's disease-fighting system (immune system) attacks the thyroid gland. This is the most common cause.  Viral infections.  Pregnancy.  Certain medicines.  Birth defects.  Past radiation treatments to the head or neck for cancer.  Past treatment with radioactive iodine.  Past exposure to radiation in the environment.  Past surgical removal of part or all of  the thyroid.  Problems with a gland in the center of the brain (pituitary gland).  Lack of enough iodine in the diet. What increases the risk? You are more likely to develop this condition if:  You are female.  You have a family history of thyroid conditions.  You use a medicine called lithium.  You take medicines that affect the immune system (immunosuppressants). What are the signs or symptoms? Symptoms of this condition include:  Feeling as though you have no energy (lethargy).  Not being able to tolerate cold.  Weight gain that is not explained by a change in diet or exercise habits.  Lack of appetite.  Dry skin.  Coarse hair.  Menstrual irregularity.  Slowing of thought processes.  Constipation.  Sadness or depression. How is this diagnosed? This condition may be diagnosed based on:  Your symptoms, your medical history, and a physical  exam.  Blood tests. You may also have imaging tests, such as an ultrasound or MRI. How is this treated? This condition is treated with medicine that replaces the thyroid hormones that your body does not make. After you begin treatment, it may take several weeks for symptoms to go away. Follow these instructions at home:  Take over-the-counter and prescription medicines only as told by your health care provider.  If you start taking any new medicines, tell your health care provider.  Keep all follow-up visits as told by your health care provider. This is important. ? As your condition improves, your dosage of thyroid hormone medicine may change. ? You will need to have blood tests regularly so that your health care provider can monitor your condition. Contact a health care provider if:  Your symptoms do not get better with treatment.  You are taking thyroid hormone replacement medicine and you: ? Sweat a lot. ? Have tremors. ? Feel anxious. ? Lose weight rapidly. ? Cannot tolerate heat. ? Have emotional swings. ? Have diarrhea. ? Feel weak. Get help right away if you have:  Chest pain.  An irregular heartbeat.  A rapid heartbeat.  Difficulty breathing. Summary  Hypothyroidism is when the thyroid gland does not make enough of certain hormones (it is underactive).  When the thyroid is underactive, it produces too little of the hormones thyroxine (T4) and triiodothyronine (T3).  The most common cause is Hashimoto's disease, a disease in which the body's disease-fighting system (immune system) attacks the thyroid gland. The condition can also be caused by viral infections, medicine, pregnancy, or past radiation treatment to the head or neck.  Symptoms may include weight gain, dry skin, constipation, feeling as though you do not have energy, and not being able to tolerate cold.  This condition is treated with medicine to replace the thyroid hormones that your body does not  make. This information is not intended to replace advice given to you by your health care provider. Make sure you discuss any questions you have with your health care provider. Document Revised: 06/11/2020 Document Reviewed: 05/27/2020 Elsevier Patient Education  2021 [place]  Raynaud phenomenon is a condition that affects the blood vessels (arteries) that carry blood to your fingers and toes. The arteries that supply blood to your ears, lips, nipples, or the tip of your nose might also be affected. Raynaud phenomenon causes the arteries to become narrow temporarily (spasm). As a result, the flow of blood to the affected areas is temporarily decreased. This usually occurs in response to cold temperatures or stress. During an attack,  the skin in the affected areas turns white, then blue, and finally red. You may also feel tingling or numbness in those areas. Attacks usually last for only a brief period, and then the blood flow to the area returns to normal. In most cases, Raynaud phenomenon does not cause serious health problems. What are the causes? In many cases, the cause of this condition is not known. The condition may occur on its own (primary Raynaud phenomenon) or may be associated with other diseases or factors (secondary Raynaud phenomenon). Possible causes may include:  Diseases or medical conditions that damage the arteries.  Injuries and repetitive actions that hurt the hands or feet.  Being exposed to certain chemicals.  Taking medicines that narrow the arteries.  Other medical conditions, such as lupus, scleroderma, rheumatoid arthritis, thyroid problems, blood disorders, Sjogren syndrome, or atherosclerosis. What increases the risk? The following factors may make you more likely to develop this condition:  Being 34-54 years old.  Being female.  Having a family history of Raynaud phenomenon.  Living in a cold climate.  Smoking. What are the  signs or symptoms? Symptoms of this condition usually occur when you are exposed to cold temperatures or when you have emotional stress. The symptoms may last for a few minutes or up to several hours. They usually affect your fingers but may also affect your toes, nipples, lips, ears, or the tip of your nose. Symptoms may include:  Changes in skin color. The skin in the affected areas will turn pale or white. The skin may then change from white to bluish to red as normal blood flow returns to the area.  Numbness, tingling, or pain in the affected areas. In severe cases, symptoms may include:  Skin sores.  Tissues decaying and dying (gangrene). How is this diagnosed? This condition may be diagnosed based on:  Your symptoms and medical history.  A physical exam. During the exam, you may be asked to put your hands in cold water to check for a reaction to cold temperature.  Tests, such as: ? Blood tests to check for other diseases or conditions. ? A test to check the movement of blood through your arteries and veins (vascular ultrasound). ? A test in which the skin at the base of your fingernail is examined under a microscope (nailfold capillaroscopy). How is this treated? Treatment for this condition often involves making lifestyle changes and taking steps to control your exposure to cold temperatures. For more severe cases, medicine (calcium channel blockers) may be used to improve blood flow. Surgery is sometimes done to block the nerves that control the affected arteries, but this is rare. Follow these instructions at home: Avoiding cold temperatures Take these steps to avoid exposure to cold:  If possible, stay indoors during cold weather.  When you go outside during cold weather, dress in layers and wear mittens, a hat, a scarf, and warm footwear.  Wear mittens or gloves when handling ice or frozen food.  Use holders for glasses or cans containing cold drinks.  Let warm water  run for a while before taking a shower or bath.  Warm up the car before driving in cold weather. Lifestyle  If possible, avoid stressful and emotional situations. Try to find ways to manage your stress, such as: ? Exercise. ? Yoga. ? Meditation. ? Biofeedback.  Do not use any products that contain nicotine or tobacco, such as cigarettes and e-cigarettes. If you need help quitting, ask your health care provider.  Avoid  secondhand smoke.  Limit your use of caffeine. ? Switch to decaffeinated coffee, tea, and soda. ? Avoid chocolate.  Avoid vibrating tools and machinery. General instructions  Protect your hands and feet from injuries, cuts, or bruises.  Avoid wearing tight rings or wristbands.  Wear loose fitting socks and comfortable, roomy shoes.  Take over-the-counter and prescription medicines only as told by your health care provider. Contact a health care provider if:  Your discomfort becomes worse despite lifestyle changes.  You develop sores on your fingers or toes that do not heal.  Your fingers or toes turn black.  You have breaks in the skin on your fingers or toes.  You have a fever.  You have pain or swelling in your joints.  You have a rash.  Your symptoms occur on only one side of your body. Summary  Raynaud phenomenon is a condition that affects the arteries that carry blood to your fingers, toes, ears, lips, nipples, or the tip of your nose.  In many cases, the cause of this condition is not known.  Symptoms of this condition include changes in skin color, and numbness and tingling of the affected area.  Treatment for this condition includes lifestyle changes, reducing exposure to cold temperatures, and using medicines for severe cases of the condition.  Contact your health care provider if your condition worsens despite treatment. This information is not intended to replace advice given to you by your health care provider. Make sure you discuss  any questions you have with your health care provider. Document Revised: 01/22/2020 Document Reviewed: 01/22/2020 Elsevier Patient Education  2021 [place] [employer] [phone] ed., pp. [phone]). [place], [place]: Elsevier.">  Perimenopause Perimenopause is the normal time of a woman's life when the levels of estrogen, the female hormone produced by the ovaries, begin to decrease. This leads to changes in menstrual periods before they stop completely (menopause). Perimenopause can begin 2-8 years before menopause. During perimenopause, the ovaries may or may not produce an egg and a woman can still become pregnant. What are the causes? This condition is caused by a natural change in hormone levels that happens as you get older. What increases the risk? This condition is more likely to start at an earlier age if you have certain medical conditions or have undergone treatments, including:  A tumor of the pituitary gland in the brain.  A disease that affects the ovaries and hormone production.  Certain cancer treatments, such as chemotherapy or hormone therapy, or radiation therapy on the pelvis.  Heavy smoking and excessive alcohol use.  Family history of early menopause. What are the signs or symptoms? Perimenopausal changes affect each woman differently. Symptoms of this condition may include:  Hot flashes.  Irregular menstrual periods.  Night sweats.  Changes in feelings about sex. This could be a decrease in sex drive or an increased discomfort around your sexuality.  Vaginal dryness.  Headaches.  Mood swings.  Depression.  Problems sleeping (insomnia).  Memory problems or trouble concentrating.  Irritability.  Tiredness.  Weight gain.  Anxiety.  Trouble getting pregnant. How is this diagnosed? This condition is diagnosed based on your medical history, a physical exam, your age, your menstrual history, and your symptoms. Hormone tests  may also be done. How is this treated? In some cases, no treatment is needed. You and your health care provider should make a decision together about whether treatment is necessary. Treatment will be based on your individual condition and preferences. Various  treatments are available, such as:  Menopausal hormone therapy (MHT).  Medicines to treat specific symptoms.  Acupuncture.  Vitamin or herbal supplements. Before starting treatment, make sure to let your health care provider know if you have a personal or family history of:  Heart disease.  Breast cancer.  Blood clots.  Diabetes.  Osteoporosis. Follow these instructions at home: Medicines  Take over-the-counter and prescription medicines only as told by your health care provider.  Take vitamin supplements only as told by your health care provider.  Talk with your health care provider before starting any herbal supplements. Lifestyle  Do not use any products that contain nicotine or tobacco, such as cigarettes, e-cigarettes, and chewing tobacco. If you need help quitting, ask your health care provider.  Get at least 30 minutes of physical activity on 5 or more days each week.  Eat a balanced diet that includes fresh fruits and vegetables, whole grains, soybeans, eggs, lean meat, and low-fat dairy.  Avoid alcoholic and caffeinated beverages, as well as spicy foods. This may help prevent hot flashes.  Get 7-8 hours of sleep each night.  Dress in layers that can be removed to help you manage hot flashes.  Find ways to manage stress, such as deep breathing, meditation, or journaling.   General instructions  Keep track of your menstrual periods, including: ? When they occur. ? How heavy they are and how long they last. ? How much time passes between periods.  Keep track of your symptoms, noting when they start, how often you have them, and how long they last.  Use vaginal lubricants or moisturizers to help with  vaginal dryness and improve comfort during sex.  You can still become pregnant if you are having irregular periods. Make sure you use contraception during perimenopause if you do not want to get pregnant.  Keep all follow-up visits. This is important. This includes any group therapy or counseling.   Contact a health care provider if:  You have heavy vaginal bleeding or pass blood clots.  Your period lasts more than 2 days longer than normal.  Your periods are recurring sooner than 21 days.  You bleed after having sex.  You have pain during sex. Get help right away if you have:  Chest pain, trouble breathing, or trouble talking.  Severe depression.  Pain when you urinate.  Severe headaches.  Vision problems. Summary  Perimenopause is the time when a woman's body begins to move into menopause. This may happen naturally or as a result of other health problems or medical treatments.  Perimenopause can begin 2-8 years before menopause, and it can last for several years.  Perimenopausal symptoms can be managed through medicines, lifestyle changes, and complementary therapies such as acupuncture. This information is not intended to replace advice given to you by your health care provider. Make sure you discuss any questions you have with your health care provider. Document Revised: 02/26/2020 Document Reviewed: 02/26/2020 Elsevier Patient Education  [place].

## 2020-12-01 ENCOUNTER — Encounter: Payer: Self-pay | Admitting: Family Medicine

## 2020-12-01 NOTE — Telephone Encounter (Signed)
This message is in response to her most recent labs:  TSH still elevated. Will need to increase the dose of synthroid and recheck TSH in 4-6 wks. Triglycerides improving. Your hemoglobin A1c was 5.9% which places you in the prediabetic range. You should work to decrease the amount of sweets and carbohydrates you are eating as well as increasing your physical activity to lose weight.  Written by Billie Ruddy, MD on 11/29/2020 5:41 PM EST Seen by patient Christina Grimes on 12/01/2020 12:03 PM

## 2020-12-03 ENCOUNTER — Telehealth: Payer: Self-pay | Admitting: Family Medicine

## 2020-12-03 NOTE — Telephone Encounter (Signed)
Rx was called in during OV do not see where a new rx has been sent. Please advise

## 2020-12-03 NOTE — Telephone Encounter (Signed)
Patient states Dr. Volanda Napoleon increased the dosage for her thyroid medication but when she went to the pharmacy to pick it up the dosage was the same.  Please advise.

## 2020-12-05 ENCOUNTER — Encounter: Payer: Self-pay | Admitting: Family Medicine

## 2020-12-07 NOTE — Telephone Encounter (Signed)
Pt is calling to check the status of her increase of Rx levothyroxine (SYNTHROID) stated she has been out of this medication for over 10 days and would like to see if the increase medication can be called in to the pharmacy she stated that she had gotten a call about her lab results and that is when they stated that Dr. Volanda Napoleon will be doing a increase in her medication.  Pharm:  [facility].

## 2020-12-07 NOTE — Progress Notes (Signed)
Results viewed on MyChart. 

## 2020-12-08 ENCOUNTER — Other Ambulatory Visit: Payer: Self-pay | Admitting: Family Medicine

## 2020-12-08 DIAGNOSIS — E039 Hypothyroidism, unspecified: Secondary | ICD-10-CM

## 2020-12-08 MED ORDER — LEVOTHYROXINE SODIUM 100 MCG PO TABS: 100.0000 ug | ORAL_TABLET | Freq: Every day | ORAL | 3 refills | Status: AC

## 2020-12-08 NOTE — Telephone Encounter (Signed)
Left detailed message Rx was sent.

## 2020-12-08 NOTE — Telephone Encounter (Signed)
[facility] sent to [facility] for Syn[facility]roid 100 mcg.

## 2020-12-11 ENCOUNTER — Encounter: Payer: Self-pay | Admitting: Family Medicine

## 2020-12-13 ENCOUNTER — Encounter: Payer: Self-pay | Admitting: Family Medicine

## 2021-03-31 ENCOUNTER — Other Ambulatory Visit: Payer: Self-pay | Admitting: Physician Assistant

## 2021-03-31 ENCOUNTER — Other Ambulatory Visit: Payer: Self-pay

## 2021-03-31 DIAGNOSIS — Z1231 Encounter for screening mammogram for malignant neoplasm of breast: Secondary | ICD-10-CM

## 2021-05-24 ENCOUNTER — Other Ambulatory Visit: Payer: Self-pay

## 2021-05-24 ENCOUNTER — Ambulatory Visit
Admission: RE | Admit: 2021-05-24 | Discharge: 2021-05-24 | Disposition: A | Discharge status: Home / Self Care | Payer: 59 | Source: Ambulatory Visit

## 2021-05-24 DIAGNOSIS — Z1231 Encounter for screening mammogram for malignant neoplasm of breast: Secondary | ICD-10-CM

## 2021-05-31 ENCOUNTER — Other Ambulatory Visit: Payer: Self-pay | Admitting: Physician Assistant

## 2021-05-31 DIAGNOSIS — R928 Other abnormal and inconclusive findings on diagnostic imaging of breast: Secondary | ICD-10-CM

## 2021-06-17 ENCOUNTER — Other Ambulatory Visit: Payer: Self-pay

## 2021-06-17 ENCOUNTER — Ambulatory Visit: Payer: 59

## 2021-06-17 ENCOUNTER — Ambulatory Visit
Admission: RE | Admit: 2021-06-17 | Discharge: 2021-06-17 | Disposition: A | Discharge status: Home / Self Care | Payer: 59 | Source: Ambulatory Visit | Attending: Physician Assistant | Admitting: Physician Assistant

## 2021-06-17 DIAGNOSIS — R928 Other abnormal and inconclusive findings on diagnostic imaging of breast: Secondary | ICD-10-CM

## 2022-02-08 ENCOUNTER — Encounter (HOSPITAL_BASED_OUTPATIENT_CLINIC_OR_DEPARTMENT_OTHER): Payer: Self-pay | Admitting: Emergency Medicine

## 2022-02-08 ENCOUNTER — Other Ambulatory Visit: Payer: Self-pay

## 2022-02-08 ENCOUNTER — Other Ambulatory Visit (HOSPITAL_BASED_OUTPATIENT_CLINIC_OR_DEPARTMENT_OTHER): Payer: Self-pay

## 2022-02-08 ENCOUNTER — Emergency Department (HOSPITAL_BASED_OUTPATIENT_CLINIC_OR_DEPARTMENT_OTHER): Payer: 59 | Admitting: Radiology

## 2022-02-08 ENCOUNTER — Emergency Department (HOSPITAL_BASED_OUTPATIENT_CLINIC_OR_DEPARTMENT_OTHER)
Admission: EM | Admit: 2022-02-08 | Discharge: 2022-02-08 | Disposition: A | Discharge status: Home / Self Care | Payer: 59 | Attending: Emergency Medicine | Admitting: Emergency Medicine

## 2022-02-08 DIAGNOSIS — M79672 Pain in left foot: Secondary | ICD-10-CM | POA: Insufficient documentation

## 2022-02-08 DIAGNOSIS — I10 Essential (primary) hypertension: Secondary | ICD-10-CM | POA: Diagnosis not present

## 2022-02-08 DIAGNOSIS — W268XXA Contact with other sharp object(s), not elsewhere classified, initial encounter: Secondary | ICD-10-CM | POA: Diagnosis not present

## 2022-02-08 DIAGNOSIS — Y9301 Activity, walking, marching and hiking: Secondary | ICD-10-CM | POA: Insufficient documentation

## 2022-02-08 DIAGNOSIS — S99922A Unspecified injury of left foot, initial encounter: Secondary | ICD-10-CM | POA: Diagnosis present

## 2022-02-08 DIAGNOSIS — S92355A Nondisplaced fracture of fifth metatarsal bone, left foot, initial encounter for closed fracture: Secondary | ICD-10-CM | POA: Insufficient documentation

## 2022-02-08 DIAGNOSIS — Z79899 Other long term (current) drug therapy: Secondary | ICD-10-CM | POA: Diagnosis not present

## 2022-02-08 DIAGNOSIS — E039 Hypothyroidism, unspecified: Secondary | ICD-10-CM | POA: Insufficient documentation

## 2022-02-08 DIAGNOSIS — S9032XA Contusion of left foot, initial encounter: Secondary | ICD-10-CM | POA: Insufficient documentation

## 2022-02-08 HISTORY — DX: Disorder of thyroid, unspecified: E07.9

## 2022-02-08 MED ORDER — OXYCODONE HCL 5 MG PO TABS
5.0000 mg | ORAL_TABLET | Freq: Once | ORAL | Status: AC
  Administered 2022-02-08: 5 mg via ORAL
  Filled 2022-02-08: qty 1

## 2022-02-08 MED ORDER — OXYCODONE HCL 5 MG PO TABS
5.0000 mg | ORAL_TABLET | ORAL | 0 refills | Status: AC | PRN
  Filled 2022-02-08: qty 10, 2d supply, fill #0

## 2022-02-08 NOTE — ED Provider Notes (Signed)
[facility]GENC[facility] DEPT Provider Note   CSN: [id number] Arrival date & time: 02/08/22  1026     History  Chief Complaint  Patient presents with   Foot Pain    Christina Grimes is a 56 y.o. female.  Patient presents today complaining of left foot pain.  She reports that she was walking today when she stepped on a pinecone with her left foot and had sudden pain in that left foot.  Denies any other injuries to the foot.  Has never had this occur before.  Has not had any fractures.       Home Medications Prior to Admission medications   Medication Sig Start Date End Date Taking? Authorizing Provider  oxyCODONE (ROXICODONE) 5 MG immediate release tablet Take 1 tablet (5 mg total) by mouth every 4 (four) hours as needed for severe pain. 02/08/22  Yes Gifford Shave, MD  atorvastatin (LIPITOR) 40 MG tablet Take 1 tablet (40 mg total) by mouth daily. 11/29/20   Billie Ruddy, MD  fish oil-omega-3 fatty acids 1000 MG capsule Take 1 g by mouth daily.    [provider]  levothyroxine (SYNTHROID) 100 MCG tablet Take 1 tablet (100 mcg total) by mouth daily. 12/08/20   Billie Ruddy, MD  losartan-hydrochlorothiazide (HYZAAR) 50-12.5 MG tablet Take 1 tablet by mouth daily. 11/29/20   Billie Ruddy, MD  MULTIPLE VITAMIN PO Take by mouth.    [provider]      Allergies    Aspirin, Norvasc [amlodipine besylate], and Tylenol [acetaminophen]    Review of Systems   Review of Systems  Constitutional:  Negative for chills and fever.  HENT:  Negative for sore throat.   Eyes:  Negative for pain and visual disturbance.  Respiratory:  Negative for cough and shortness of breath.   Cardiovascular:  Negative for chest pain and palpitations.  Gastrointestinal:  Negative for abdominal pain and vomiting.  Genitourinary:  Negative for dysuria.  Musculoskeletal:  Positive for arthralgias. Negative for back pain.  Skin:  Negative for color change and  rash.  All other systems reviewed and are negative.  Physical Exam Updated Vital Signs BP 117/73 (BP Location: Right Arm)   Pulse 85   Temp 97.9 F (36.6 C) (Oral)   Resp 16   Ht '5\' 5"'$  (1.651 m)   Wt 68 kg   LMP 01/20/2022   SpO2 98%   BMI 24.96 kg/m  Physical Exam Constitutional:      Appearance: Normal appearance.  HENT:     Head: Normocephalic and atraumatic.     Right Ear: External ear normal.     Left Ear: External ear normal.     Nose: Nose normal.     Mouth/Throat:     Mouth: Mucous membranes are moist.  Eyes:     Extraocular Movements: Extraocular movements intact.  Cardiovascular:     Rate and Rhythm: Normal rate.  Pulmonary:     Effort: Pulmonary effort is normal.  Abdominal:     General: Abdomen is flat.  Musculoskeletal:        General: Swelling (Swelling in the left foot on the lateral aspect of the left foot.  Bruising starting as well.) present.     Comments: Patient with full range of motion of the toes on the left foot.  Palpable pulses.  Skin:    General: Skin is warm.     Capillary Refill: Capillary refill takes less than 2 seconds.  Neurological:  General: No focal deficit present.     Mental Status: She is alert.    ED Results / Procedures / Treatments   Labs (all labs ordered are listed, but only abnormal results are displayed) Labs Reviewed - No data to display  EKG None  Radiology DG Foot Complete Left  Result Date: 02/08/2022 CLINICAL DATA:  56 year old female with left lateral foot pain and swelling after stepping on fine cone this morning. EXAM: LEFT FOOT - COMPLETE 3+ VIEW COMPARISON:  None Available. FINDINGS: Bone mineralization is within normal limits. Transverse nondisplaced fracture at the base of the left metatarsal. Mild regional soft tissue swelling. This is intra-articular. But elsewhere osseous structures throughout the left foot appear intact and normally aligned. Normal joint spaces. IMPRESSION: Transverse nondisplaced  fracture at the base of the left fifth metatarsal. Electronically Signed   By: Genevie Ann M.D.   On: 02/08/2022 11:20    Procedures Procedures   Medications Ordered in ED Medications  oxyCODONE (Oxy IR/ROXICODONE) immediate release tablet 5 mg (has no administration in time range)    ED Course/ Medical Decision Making/ A&P                           Medical Decision Making Patient presents for left foot pain.  Stepped on pinecone in her left foot.  On arrival she had x-rays performed showing nondisplaced fracture at the base of the left fifth metatarsal.  Gave patient oxycodone 5 mg for pain and CAM boot placed.  Patient given prescription for 10 pills of oxycodone and instructed to follow-up with orthopedics in the outpatient setting.  Amount and/or Complexity of Data Reviewed Radiology: ordered.  Risk Prescription drug management.   Final Clinical Impression(s) / ED Diagnoses Final diagnoses:  Nondisplaced fracture of fifth metatarsal bone, left foot, initial encounter for closed fracture    Rx / DC Orders ED Discharge Orders          Ordered    oxyCODONE (ROXICODONE) 5 MG immediate release tablet  Every 4 hours PRN        02/08/22 1206              Gifford Shave, MD 02/08/22 2426    Elnora Morrison, MD 02/11/22 [phone]

## 2022-02-08 NOTE — Discharge Instructions (Signed)
It was a pleasure taking care of you today.  Unfortunately you did break a bone in your foot.  We have placed this cam boot and I am send you a prescription for a few doses of oxycodone for the next few days.  I would like for you to follow-up with the orthopedist of your choosing.  The on-call orthopedist today was Dr. Doreatha Martin.  If you have any issues please follow-up with your primary care provider or you can return for further evaluation.  I hope you have a wonderful day! ?

## 2022-02-08 NOTE — ED Triage Notes (Signed)
Pt stepped on a pine cone while walking this morning. C/o L foot pain.  ?

## 2022-02-18 ENCOUNTER — Other Ambulatory Visit: Payer: Self-pay | Admitting: Family Medicine

## 2022-02-18 DIAGNOSIS — E039 Hypothyroidism, unspecified: Secondary | ICD-10-CM

## 2022-05-16 ENCOUNTER — Other Ambulatory Visit: Payer: Self-pay | Admitting: Physician Assistant

## 2022-05-16 DIAGNOSIS — Z1231 Encounter for screening mammogram for malignant neoplasm of breast: Secondary | ICD-10-CM

## 2022-06-08 ENCOUNTER — Ambulatory Visit: Payer: 59

## 2022-06-27 ENCOUNTER — Ambulatory Visit
Admission: RE | Admit: 2022-06-27 | Discharge: 2022-06-27 | Disposition: A | Discharge status: Home / Self Care | Payer: 59 | Source: Ambulatory Visit | Attending: Physician Assistant | Admitting: Physician Assistant

## 2022-06-27 DIAGNOSIS — Z1231 Encounter for screening mammogram for malignant neoplasm of breast: Secondary | ICD-10-CM

## 2022-11-29 IMAGING — MG MM DIGITAL DIAGNOSTIC UNILAT*R* W/ TOMO W/ CAD
6 series · 6 of 18 positions shown · non-contrast
Comparison: Previous exam(s).

CLINICAL DATA: Screening recall for a right breast asymmetry.

EXAM:
DIGITAL DIAGNOSTIC UNILATERAL RIGHT MAMMOGRAM WITH TOMOSYNTHESIS AND
CAD
TECHNIQUE: Right digital diagnostic mammography and breast tomosynthesis was
performed. The images were evaluated with computer-aided detection.

[R ML synth-2D]
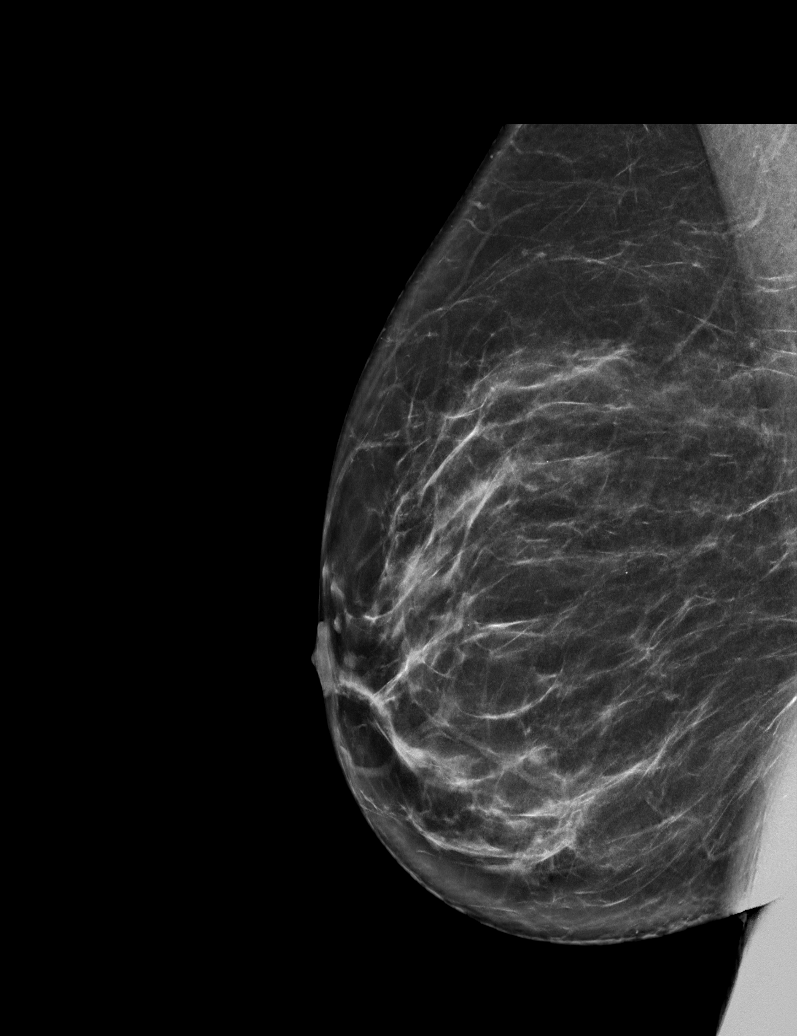

[R MLO synth-2D (1 of 2)]
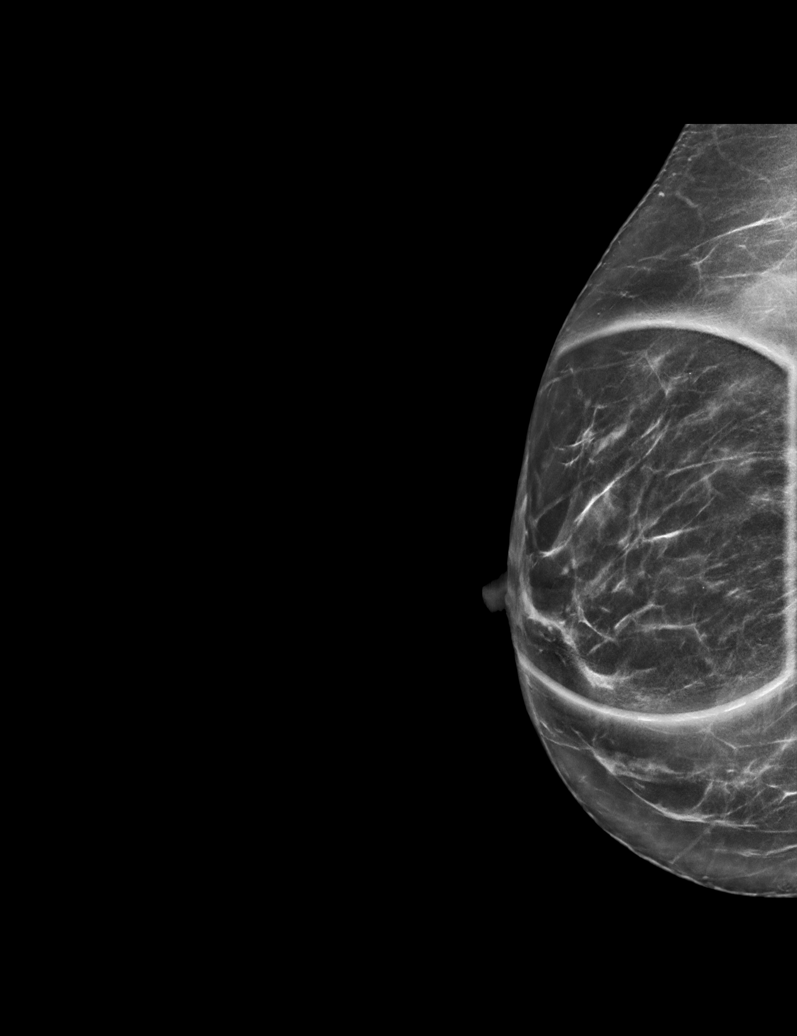

[R MLO synth-2D (2 of 2)]
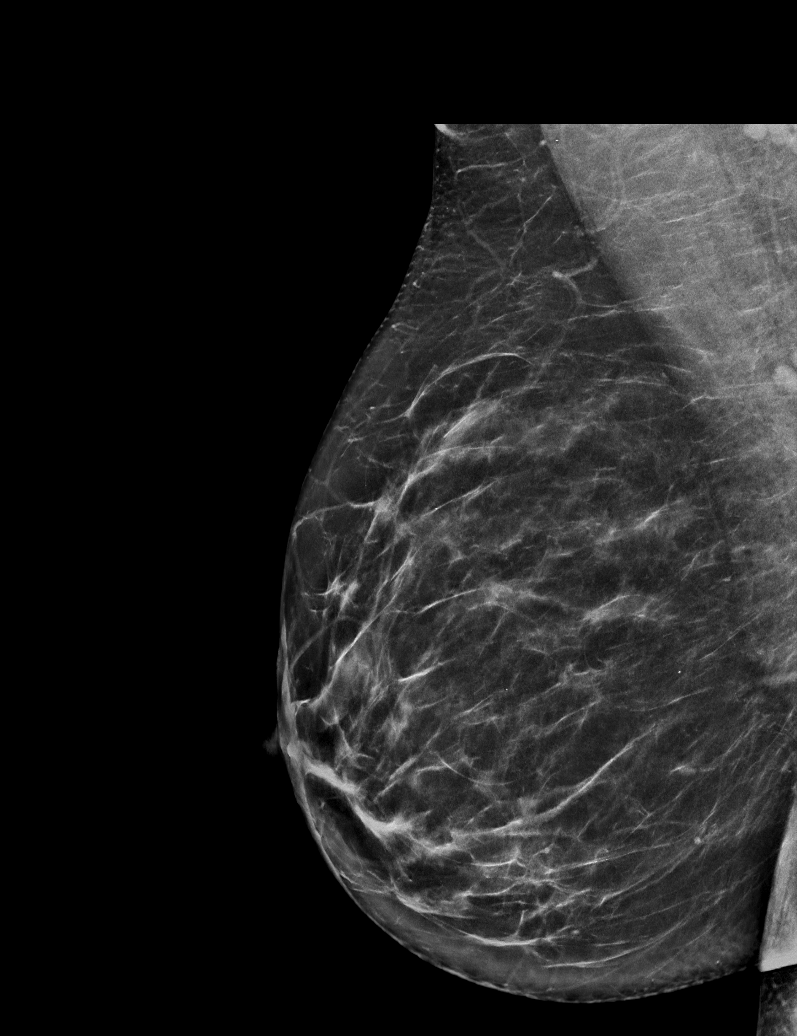

[R ML tomo · tomo slice 35/69.0]
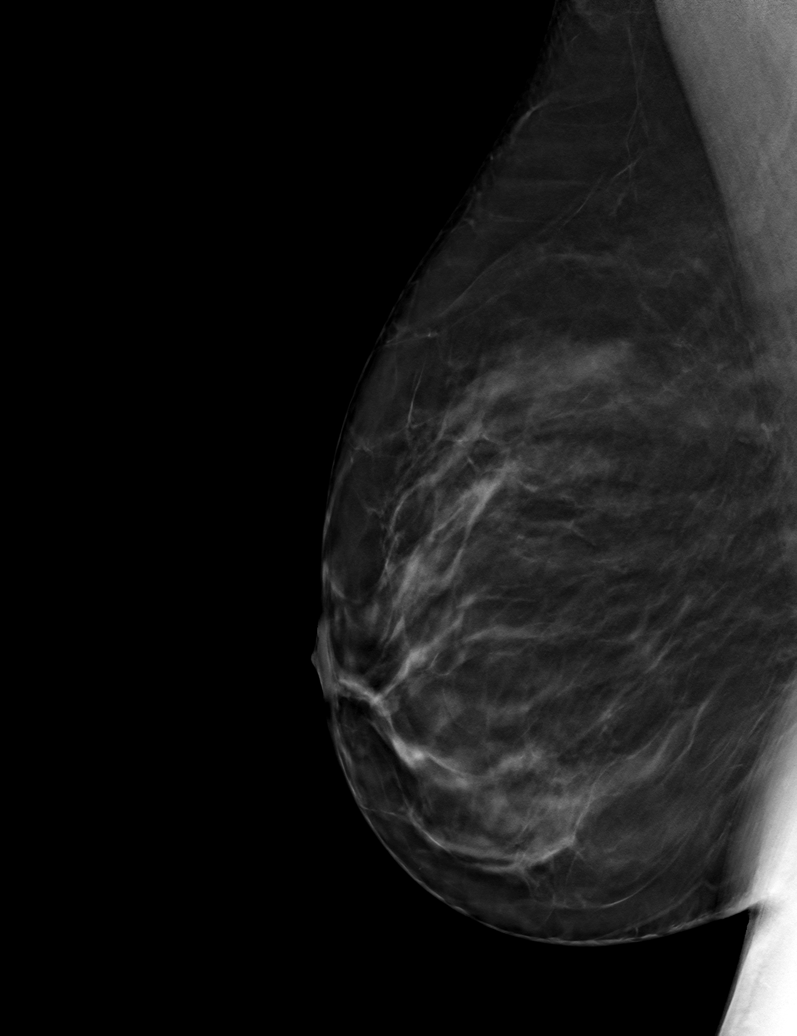

[R MLO tomo (1 of 2) · tomo slice 36/71.0]
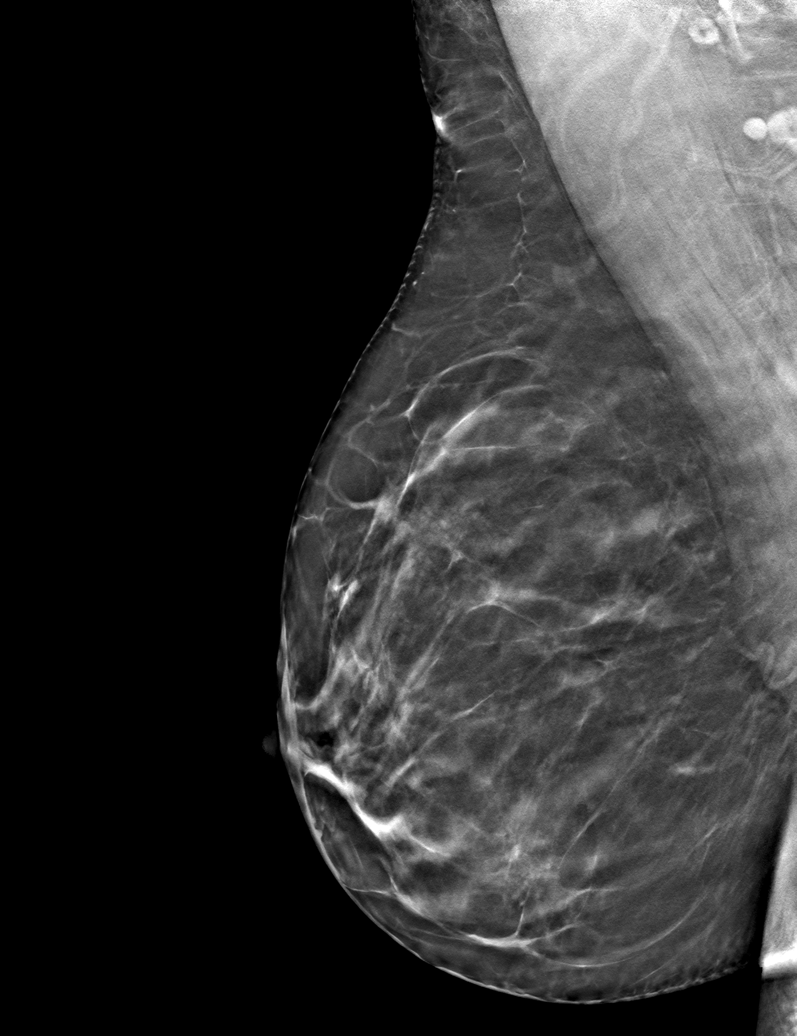

[R MLO tomo (2 of 2) · tomo slice 34/67.0]
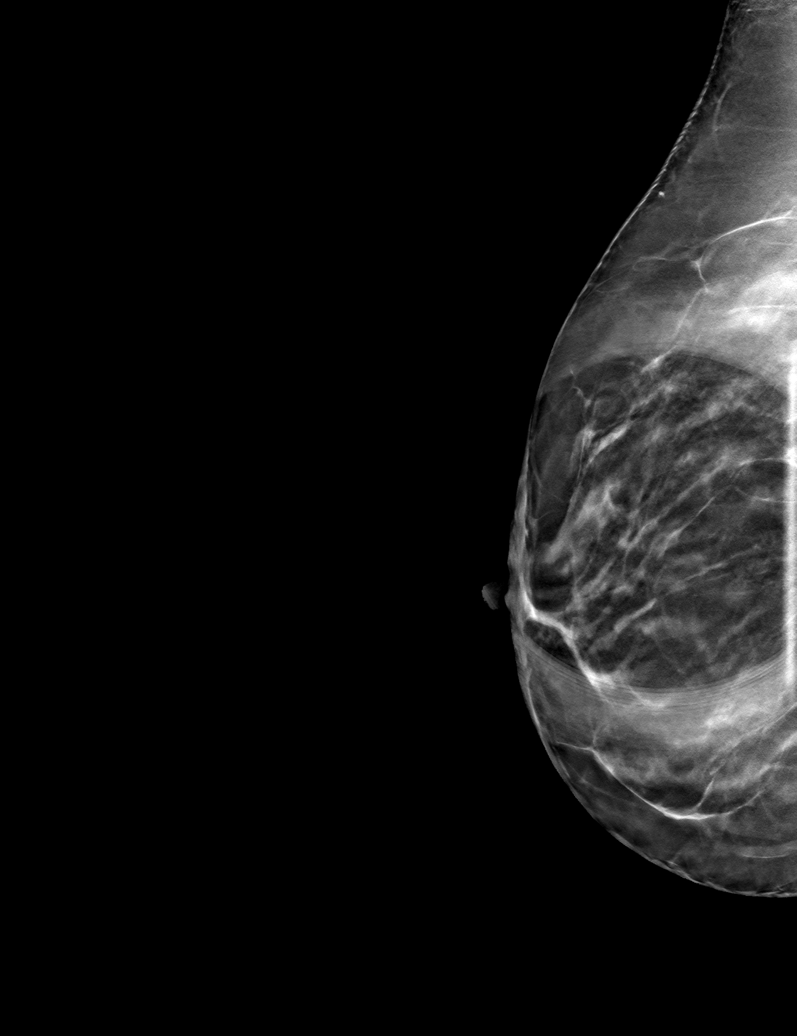

[6 of 18 positions shown; findings below may reference images not displayed]

ACR Breast Density Category b: There are scattered areas of
fibroglandular density.
FINDINGS: Spot compression tomosynthesis images through the right breast
demonstrates resolution of the asymmetry identified on the screening
mammogram. A repeat full paddle MLO image of the right breast was
performed also demonstrating resolution of the asymmetry.
IMPRESSION: Resolution of the right breast asymmetry consistent with overlapping
fibroglandular tissue.

RECOMMENDATION:
Screening mammogram in one year.(Code:8E-1-44A)

I have discussed the findings and recommendations with the patient.
If applicable, a reminder letter will be sent to the patient
regarding the next appointment.

BI-RADS CATEGORY  1: Negative.

## 2023-06-05 ENCOUNTER — Other Ambulatory Visit: Payer: Self-pay | Admitting: Physician Assistant

## 2023-06-05 DIAGNOSIS — Z1231 Encounter for screening mammogram for malignant neoplasm of breast: Secondary | ICD-10-CM

## 2023-07-16 ENCOUNTER — Ambulatory Visit
Admission: RE | Admit: 2023-07-16 | Discharge: 2023-07-16 | Disposition: A | Discharge status: Home / Self Care | Payer: 59 | Source: Ambulatory Visit | Attending: Physician Assistant | Admitting: Physician Assistant

## 2023-07-16 DIAGNOSIS — Z1231 Encounter for screening mammogram for malignant neoplasm of breast: Secondary | ICD-10-CM

## 2023-07-23 IMAGING — DX DG FOOT COMPLETE 3+V*L*
3 series · 3 of 3 positions shown · non-contrast
Comparison: None Available.

CLINICAL DATA: 55-year-old female with left lateral foot pain and
swelling after stepping on fine cone this morning.

EXAM:
LEFT FOOT - COMPLETE 3+ VIEW

[foot ap]
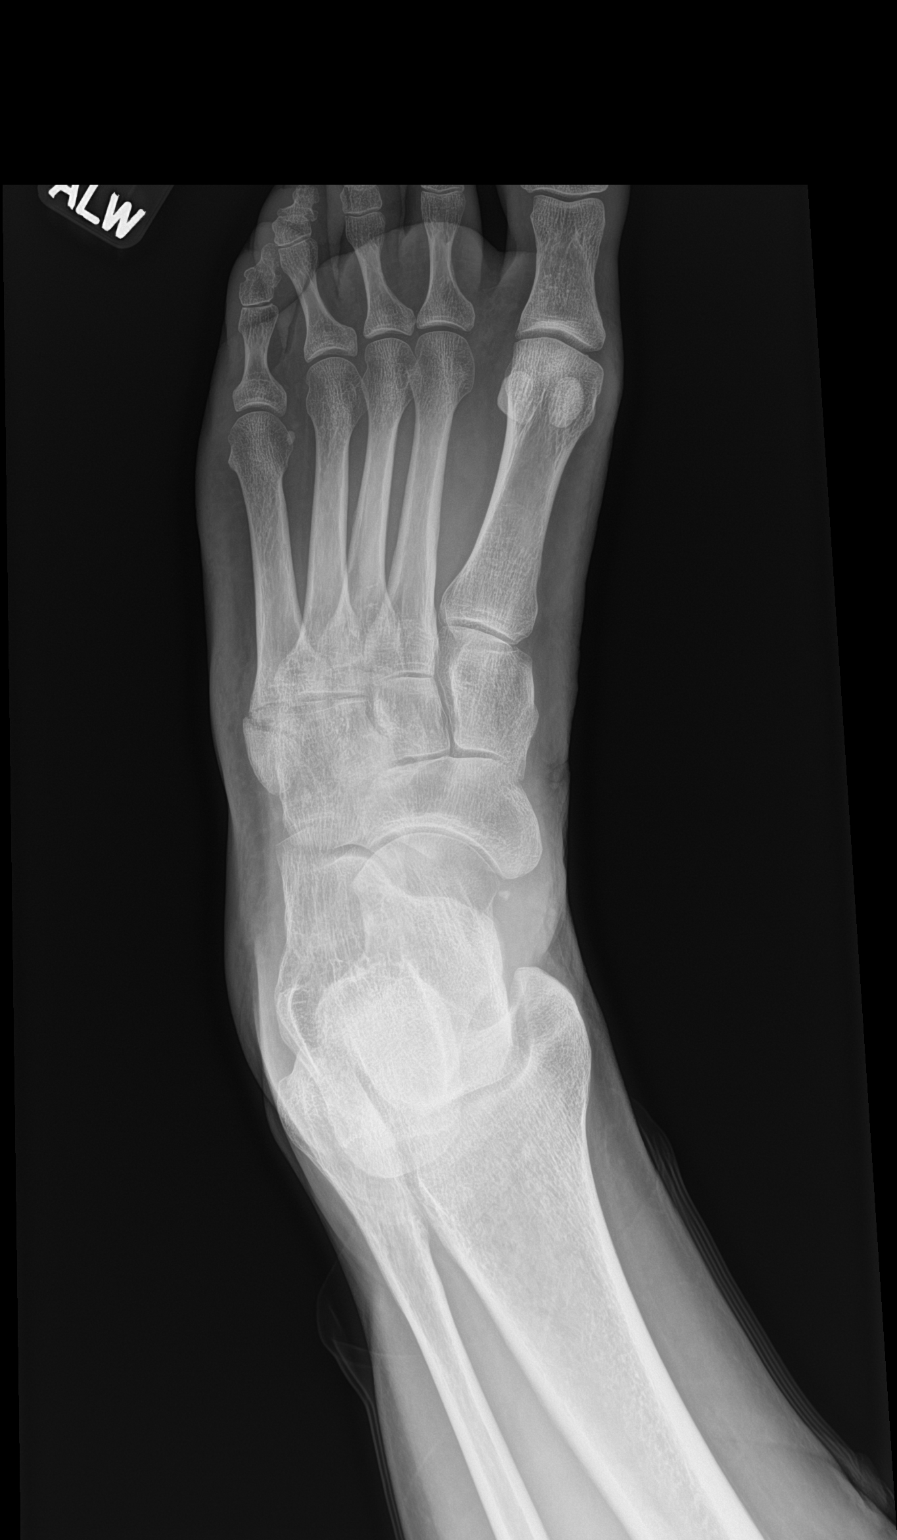

[foot obl]
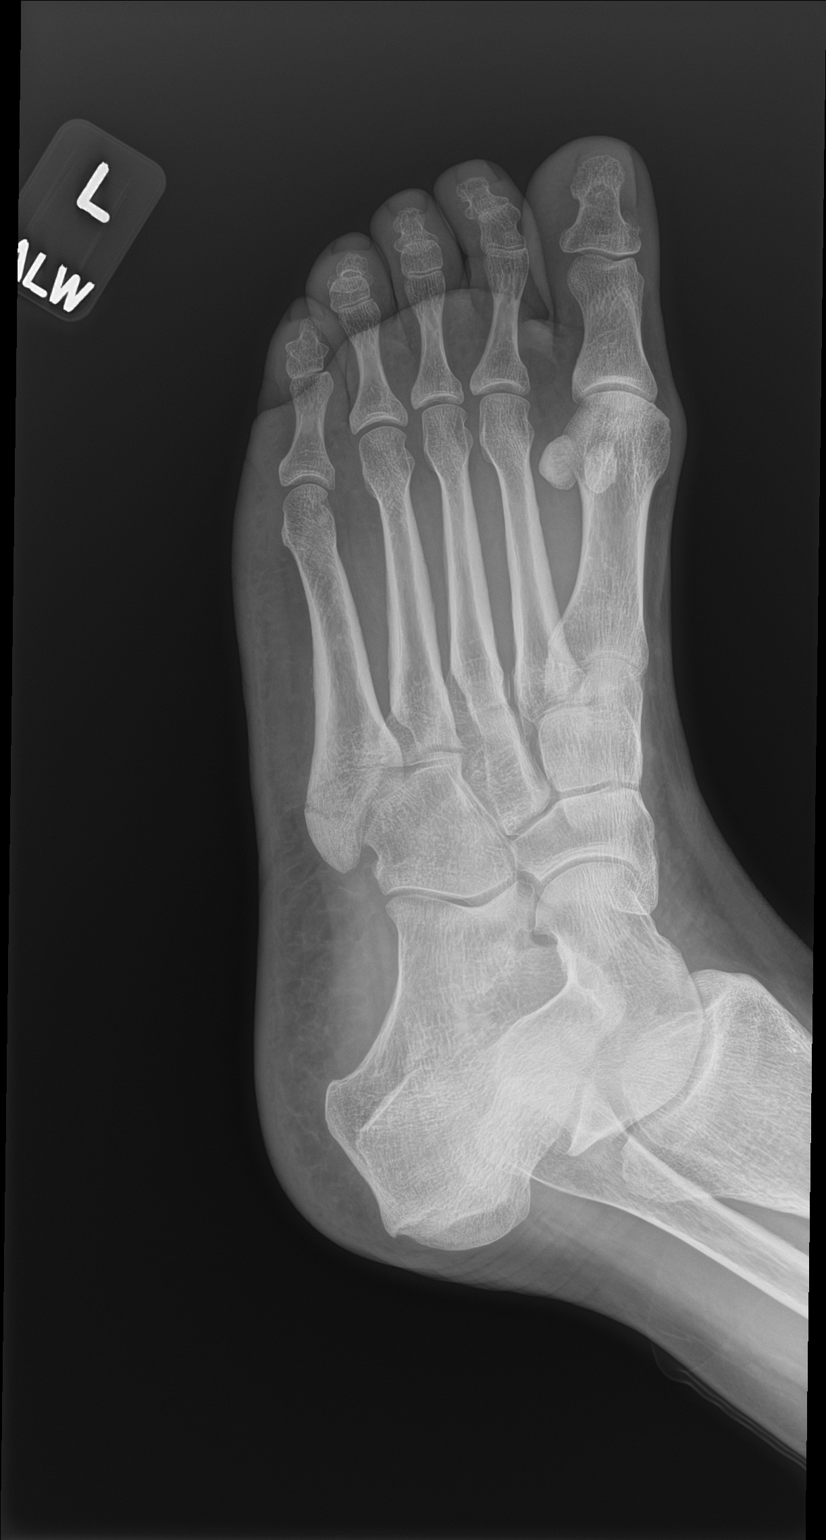

[foot lat]
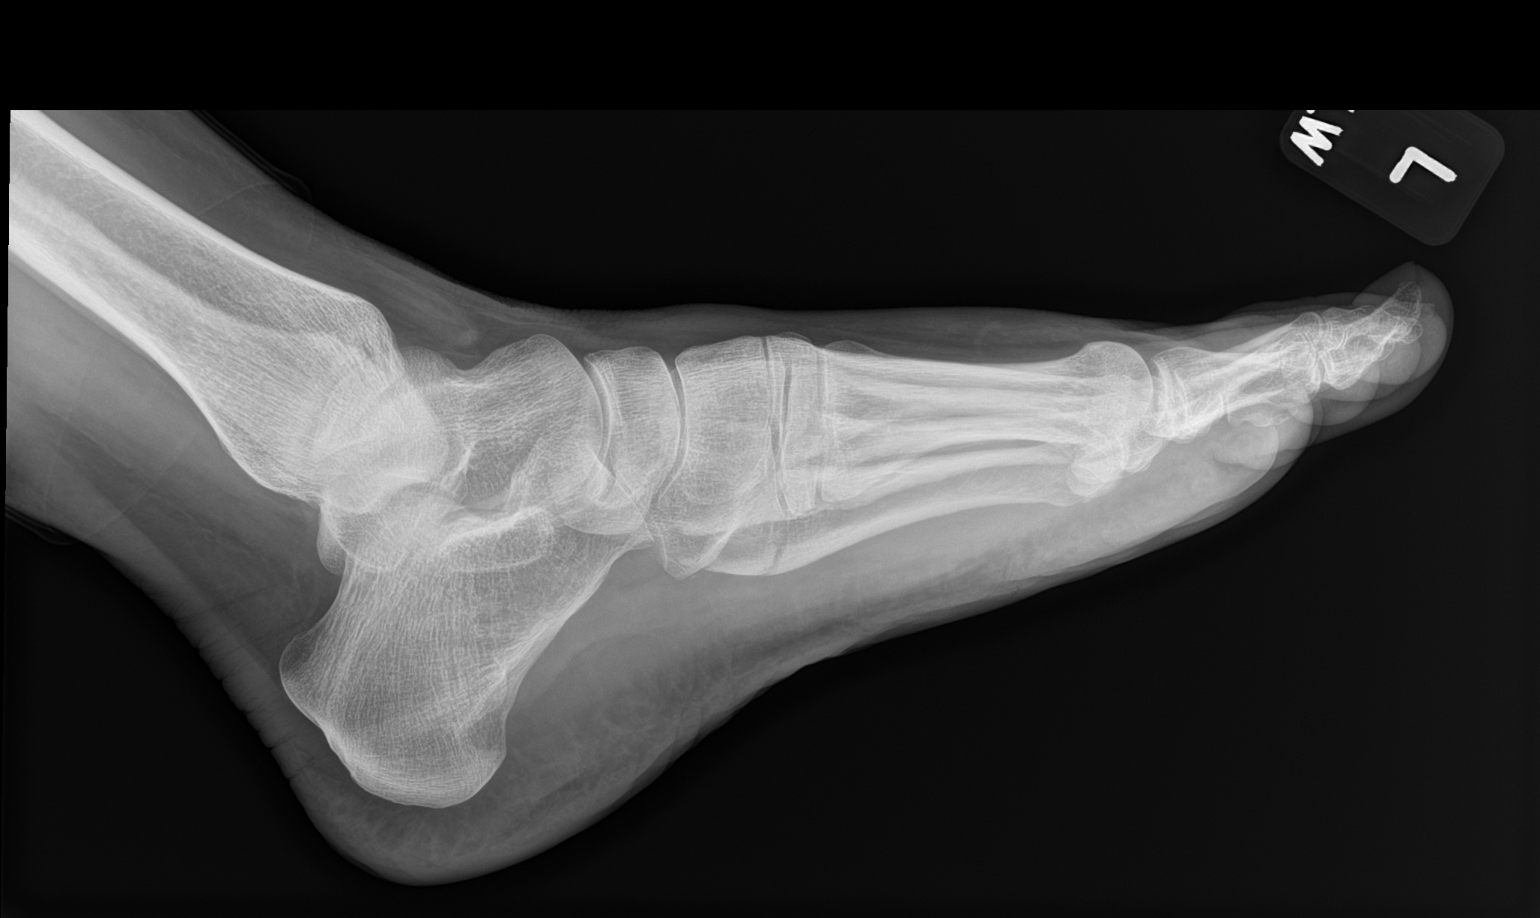

[3 of 3 positions shown; findings below may reference images not displayed]

FINDINGS: Bone mineralization is within normal limits. Transverse nondisplaced
fracture at the base of the left metatarsal. Mild regional soft
tissue swelling. This is intra-articular.

But elsewhere osseous structures throughout the left foot appear
intact and normally aligned. Normal joint spaces.
IMPRESSION: Transverse nondisplaced fracture at the base of the left fifth
metatarsal.

## 2024-06-16 ENCOUNTER — Other Ambulatory Visit: Payer: Self-pay | Admitting: Physician Assistant

## 2024-06-16 DIAGNOSIS — Z1231 Encounter for screening mammogram for malignant neoplasm of breast: Secondary | ICD-10-CM

## 2024-07-16 ENCOUNTER — Ambulatory Visit

## 2024-08-07 ENCOUNTER — Ambulatory Visit

## 2024-09-11 ENCOUNTER — Other Ambulatory Visit: Payer: Self-pay

## 2024-09-11 ENCOUNTER — Inpatient Hospital Stay
Admission: RE | Admit: 2024-09-11 | Discharge: 2024-09-11 | Attending: Physician Assistant | Admitting: Physician Assistant

## 2024-09-11 DIAGNOSIS — Z1231 Encounter for screening mammogram for malignant neoplasm of breast: Secondary | ICD-10-CM
